# Patient Record
Sex: Female | Born: 1950 | Hispanic: No | Marital: Married | Smoking: Never smoker
Health system: Southern US, Community
[De-identification: ages and names within clinical notes are randomized; demographics above are authoritative.]

## PROBLEM LIST (undated history)

## (undated) DIAGNOSIS — F329 Major depressive disorder, single episode, unspecified: Secondary | ICD-10-CM

## (undated) DIAGNOSIS — I10 Essential (primary) hypertension: Secondary | ICD-10-CM

## (undated) DIAGNOSIS — I700 Atherosclerosis of aorta: Secondary | ICD-10-CM

## (undated) DIAGNOSIS — K769 Liver disease, unspecified: Secondary | ICD-10-CM

## (undated) DIAGNOSIS — K760 Fatty (change of) liver, not elsewhere classified: Secondary | ICD-10-CM

## (undated) DIAGNOSIS — R945 Abnormal results of liver function studies: Secondary | ICD-10-CM

## (undated) DIAGNOSIS — F419 Anxiety disorder, unspecified: Secondary | ICD-10-CM

## (undated) DIAGNOSIS — F32A Depression, unspecified: Secondary | ICD-10-CM

## (undated) DIAGNOSIS — K8020 Calculus of gallbladder without cholecystitis without obstruction: Secondary | ICD-10-CM

## (undated) DIAGNOSIS — M8580 Other specified disorders of bone density and structure, unspecified site: Secondary | ICD-10-CM

## (undated) DIAGNOSIS — R7989 Other specified abnormal findings of blood chemistry: Secondary | ICD-10-CM

## (undated) DIAGNOSIS — K219 Gastro-esophageal reflux disease without esophagitis: Secondary | ICD-10-CM

## (undated) DIAGNOSIS — K449 Diaphragmatic hernia without obstruction or gangrene: Secondary | ICD-10-CM

## (undated) DIAGNOSIS — E559 Vitamin D deficiency, unspecified: Secondary | ICD-10-CM

## (undated) DIAGNOSIS — R7303 Prediabetes: Secondary | ICD-10-CM

## (undated) HISTORY — DX: Vitamin D deficiency, unspecified: E55.9

## (undated) HISTORY — DX: Other specified disorders of bone density and structure, unspecified site: M85.80

## (undated) HISTORY — DX: Anxiety disorder, unspecified: F41.9

## (undated) HISTORY — DX: Essential (primary) hypertension: I10

## (undated) HISTORY — DX: Depression, unspecified: F32.A

## (undated) HISTORY — DX: Prediabetes: R73.03

## (undated) HISTORY — DX: Diaphragmatic hernia without obstruction or gangrene: K44.9

## (undated) HISTORY — DX: Gastro-esophageal reflux disease without esophagitis: K21.9

## (undated) HISTORY — DX: Major depressive disorder, single episode, unspecified: F32.9

## (undated) HISTORY — DX: Calculus of gallbladder without cholecystitis without obstruction: K80.20

## (undated) HISTORY — DX: Other specified abnormal findings of blood chemistry: R79.89

## (undated) HISTORY — DX: Abnormal results of liver function studies: R94.5

## (undated) HISTORY — DX: Liver disease, unspecified: K76.9

## (undated) HISTORY — DX: Atherosclerosis of aorta: I70.0

## (undated) HISTORY — DX: Fatty (change of) liver, not elsewhere classified: K76.0

## (undated) HISTORY — PX: NEPHRECTOMY: SHX65

---

## 1997-05-13 ENCOUNTER — Other Ambulatory Visit: Admission: RE | Admit: 1997-05-13 | Discharge: 1997-05-13 | Payer: Self-pay | Admitting: Obstetrics and Gynecology

## 2000-03-02 ENCOUNTER — Encounter: Payer: Self-pay | Admitting: Obstetrics and Gynecology

## 2000-03-02 ENCOUNTER — Encounter: Admission: RE | Admit: 2000-03-02 | Discharge: 2000-03-02 | Payer: Self-pay | Admitting: Obstetrics and Gynecology

## 2000-07-05 ENCOUNTER — Other Ambulatory Visit: Admission: RE | Admit: 2000-07-05 | Discharge: 2000-07-05 | Payer: Self-pay | Admitting: Obstetrics and Gynecology

## 2001-07-24 ENCOUNTER — Other Ambulatory Visit: Admission: RE | Admit: 2001-07-24 | Discharge: 2001-07-24 | Payer: Self-pay | Admitting: Obstetrics and Gynecology

## 2002-07-24 ENCOUNTER — Encounter: Payer: Self-pay | Admitting: Obstetrics and Gynecology

## 2002-07-24 ENCOUNTER — Encounter: Admission: RE | Admit: 2002-07-24 | Discharge: 2002-07-24 | Payer: Self-pay | Admitting: Obstetrics and Gynecology

## 2002-08-27 ENCOUNTER — Other Ambulatory Visit: Admission: RE | Admit: 2002-08-27 | Discharge: 2002-08-27 | Payer: Self-pay | Admitting: Obstetrics and Gynecology

## 2002-09-06 ENCOUNTER — Encounter: Admission: RE | Admit: 2002-09-06 | Discharge: 2002-09-06 | Payer: Self-pay | Admitting: Obstetrics and Gynecology

## 2002-09-06 ENCOUNTER — Encounter: Payer: Self-pay | Admitting: Obstetrics and Gynecology

## 2003-12-23 ENCOUNTER — Encounter: Admission: RE | Admit: 2003-12-23 | Discharge: 2003-12-23 | Payer: Self-pay | Admitting: Obstetrics and Gynecology

## 2004-12-23 ENCOUNTER — Encounter: Admission: RE | Admit: 2004-12-23 | Discharge: 2004-12-23 | Payer: Self-pay | Admitting: Family Medicine

## 2006-01-09 ENCOUNTER — Inpatient Hospital Stay (HOSPITAL_COMMUNITY): Admission: AD | Admit: 2006-01-09 | Discharge: 2006-01-11 | Payer: Self-pay | Admitting: *Deleted

## 2006-01-11 ENCOUNTER — Ambulatory Visit: Payer: Self-pay | Admitting: Oncology

## 2006-01-19 LAB — COMPREHENSIVE METABOLIC PANEL
AST: 25 U/L (ref 0–37)
Alkaline Phosphatase: 83 U/L (ref 39–117)
BUN: 17 mg/dL (ref 6–23)
CO2: 29 mEq/L (ref 19–32)
Chloride: 101 mEq/L (ref 96–112)
Creatinine, Ser: 0.85 mg/dL (ref 0.40–1.20)
Glucose, Bld: 94 mg/dL (ref 70–99)
Potassium: 3.5 mEq/L (ref 3.5–5.3)

## 2006-01-19 LAB — CBC WITH DIFFERENTIAL/PLATELET
Basophils Absolute: 0 10*3/uL (ref 0.0–0.1)
Eosinophils Absolute: 0.3 10*3/uL (ref 0.0–0.5)
HCT: 42.1 % (ref 34.8–46.6)
HGB: 14.4 g/dL (ref 11.6–15.9)
LYMPH%: 29.2 % (ref 14.0–48.0)
MCV: 90.9 fL (ref 81.0–101.0)
MONO#: 0.6 10*3/uL (ref 0.1–0.9)
MONO%: 5.8 % (ref 0.0–13.0)
NEUT#: 6.6 10*3/uL — ABNORMAL HIGH (ref 1.5–6.5)
Platelets: 303 10*3/uL (ref 145–400)
RBC: 4.63 10*6/uL (ref 3.70–5.32)
WBC: 10.7 10*3/uL — ABNORMAL HIGH (ref 3.9–10.0)
lymph#: 3.1 10*3/uL (ref 0.9–3.3)

## 2006-01-25 ENCOUNTER — Encounter: Admission: RE | Admit: 2006-01-25 | Discharge: 2006-01-25 | Payer: Self-pay | Admitting: Oncology

## 2006-04-13 ENCOUNTER — Encounter: Admission: RE | Admit: 2006-04-13 | Discharge: 2006-04-13 | Payer: Self-pay | Admitting: Interventional Radiology

## 2007-01-16 ENCOUNTER — Encounter: Admission: RE | Admit: 2007-01-16 | Discharge: 2007-01-16 | Payer: Self-pay | Admitting: Family Medicine

## 2007-04-17 ENCOUNTER — Encounter: Admission: RE | Admit: 2007-04-17 | Discharge: 2007-04-17 | Payer: Self-pay | Admitting: Family Medicine

## 2007-04-25 ENCOUNTER — Encounter: Admission: RE | Admit: 2007-04-25 | Discharge: 2007-04-25 | Payer: Self-pay | Admitting: Interventional Radiology

## 2008-01-28 ENCOUNTER — Encounter: Admission: RE | Admit: 2008-01-28 | Discharge: 2008-01-28 | Payer: Self-pay | Admitting: Family Medicine

## 2008-02-05 ENCOUNTER — Encounter: Admission: RE | Admit: 2008-02-05 | Discharge: 2008-02-05 | Payer: Self-pay | Admitting: Family Medicine

## 2008-03-26 ENCOUNTER — Emergency Department (HOSPITAL_COMMUNITY): Admission: EM | Admit: 2008-03-26 | Discharge: 2008-03-26 | Payer: Self-pay | Admitting: Emergency Medicine

## 2009-02-17 ENCOUNTER — Encounter: Admission: RE | Admit: 2009-02-17 | Discharge: 2009-02-17 | Payer: Self-pay | Admitting: Family Medicine

## 2009-03-02 ENCOUNTER — Encounter: Admission: RE | Admit: 2009-03-02 | Discharge: 2009-03-02 | Payer: Self-pay | Admitting: Family Medicine

## 2010-01-31 ENCOUNTER — Encounter: Payer: Self-pay | Admitting: *Deleted

## 2010-01-31 ENCOUNTER — Encounter (HOSPITAL_COMMUNITY): Payer: Self-pay | Admitting: Oncology

## 2010-03-05 ENCOUNTER — Other Ambulatory Visit: Payer: Self-pay | Admitting: Family Medicine

## 2010-03-05 DIAGNOSIS — Z1231 Encounter for screening mammogram for malignant neoplasm of breast: Secondary | ICD-10-CM

## 2010-03-19 ENCOUNTER — Ambulatory Visit
Admission: RE | Admit: 2010-03-19 | Discharge: 2010-03-19 | Disposition: A | Discharge status: Home / Self Care | Payer: 59 | Source: Ambulatory Visit | Attending: Family Medicine | Admitting: Family Medicine

## 2010-03-19 DIAGNOSIS — Z1231 Encounter for screening mammogram for malignant neoplasm of breast: Secondary | ICD-10-CM

## 2010-05-28 NOTE — H&P (Signed)
NAMETENITA, CUE NO.:  [id number]   MEDICAL RECORD NO.:  [id number]          PATIENT TYPE:  INP   LOCATION:  5738                         FACILITY:  [facility]   PHYSICIAN:  Michaelyn Barter, M.D. DATE OF BIRTH:  26-Jan-1950   DATE OF ADMISSION:  01/09/2006  DATE OF DISCHARGE:  01/11/2006                              HISTORY & PHYSICAL   Audio too short to transcribe (less than 5 seconds)      Michaelyn Barter, M.D.     OR/MEDQ  D:  01/14/2006  T:  01/14/2006  Job:  [id number]

## 2010-05-28 NOTE — H&P (Signed)
NAMESAVONNA, BIRCHMEIER NO.:  [id number]   MEDICAL RECORD NO.:  [id number]          PATIENT TYPE:  INP   LOCATION:  5738                         FACILITY:  [facility]   PHYSICIAN:  Hettie Holstein, D.O.    DATE OF BIRTH:  03-18-1950   DATE OF ADMISSION:  01/09/2006  DATE OF DISCHARGE:                              HISTORY & PHYSICAL   PRIMARY CARE PHYSICIAN:  Tammy R. Collins Scotland, M.D.   UROLOGISTLucrezia Starch. Earlene Plater, M.D.   GASTROENTEROLOGIST:  Griffith Citron, M.D.   HISTORY OF PRESENTING ILLNESS:  Alexandra Davies is a very pleasant, 60-  year-old female typically in excellent health who has a history of  nephrectomy at age 6 felt to be possibly related to kidney stones.  In  any event, she has a previous history of hepatitis.  Very few details  are available on this; however, she has undergone periodic blood work in  the outpatient setting through her primary care physician and was noted  to have elevated ALT.  She underwent abdominal ultrasound that was  abnormal and subsequently underwent MRI of her liver that had also been  concerning for a mass and she was referred for diagnostic evaluation as  an inpatient.  She has remained asymptomatic.  At present we are going  to get another CT scan and consider biopsy based on this study.  I have  discussed these images with Dr. Ruel Favors of Interventional Radiology  and has requested a CT scan.   PAST MEDICAL HISTORY:  Her past medical history is significant for:  1. Hepatitis.  No further details available at this time.  She has      seen Dr. Kinnie Scales in 1996 for this.  No biopsy was performed at that      time.  2. History of hypertension.  3. 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  She      has retained her appendix, uterus, gallbladder.  4. 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  5. She states that she follows routinely with her mammograms and these      have always been normal.  6. She has  not had a screening colonoscopy.   MEDICATIONS:  1. She takes Dyazide daily.  2. Estrace cream.  3. Osteo Bioflex.  4. Zoloft 100 mg daily.  5. Mobic on a p.r.n. basis.  6. Xanax 0.5 mg typically prior to any procedures p.r.n.   ALLERGIES:  SHE HAS NO KNOWN DRUG ALLERGIES.   SOCIAL HISTORY:  She is [place].  She has lived here since 1963.  She works for an [employer], [employer].  She denies tobacco.  She drinks 1  or 2 glasses of wine on a daily basis.   FAMILY HISTORY:  She does not know her mother's history.  Her father  died just a week ago at age 28.  There is no known family illnesses or  cancer that they are aware of, though family history is quite limited.   REVIEW OF SYSTEMS:  Her weight and appetite have been stable.  She has  had some blood in her urine that she is being referred to a urologist  for.  She has seen dr. Earlene Plater for this in the past.  She has not had  swelling of her lower extremities.  No blood in her stools.  She is  amenorrheic.  No nausea, vomiting, diarrhea, chest pain or shortness of  breath.  Her review of systems is unremarkable.   PHYSICAL EXAMINATION:  VITAL SIGNS:  Her vital signs were stable.  Temperature 98.3, heart rate 80, respirations 20, blood pressure 162/83,  O2 saturation 97% on room air.   PLAN:  At this time [name] will be admitted to initiate diagnostic  workup.  She will undergo a CT scan.  If biopsy is intended, this may  occur as an inpatient versus outpatient setting.  We will order initial  studies and provide further direction to conclude either as an inpatient  or outpatient setting.  I believe her primary care physician has already  contacted Dr. Arline Asp.      Hettie Holstein, D.O.  Electronically Signed     ESS/MEDQ  D:  01/09/2006  T:  01/10/2006  Job:  [id number]   cc:   Tammy R. Collins Scotland, M.D.  Samul Dada, M.D.  Ronald L. Earlene Plater, M.D.  Griffith Citron, M.D.

## 2010-05-28 NOTE — Discharge Summary (Signed)
NAMESUMAYA, RIEDESEL NO.:  [id number]   MEDICAL RECORD NO.:  [id number]          PATIENT TYPE:  INP   LOCATION:  5738                         FACILITY:  [facility]   PHYSICIAN:  Michaelyn Barter, M.D. DATE OF BIRTH:  [birth year]   DATE OF ADMISSION:  01/09/2006  DATE OF DISCHARGE:  01/11/2006                               DISCHARGE SUMMARY   FINAL DIAGNOSES:  1. Liver mass.  2. Questionable vertebral mets.   PROCEDURES:  1. CT scan of the chest with contrast completed January 10, 2006.  2. CT scan of the abdomen and pelvis with contrast completed January 10, 2006.   CONSULTATIONS:  Interventional radiology.   HISTORY OF PRESENT ILLNESS:  Mrs. Eifler is a 60 year old female who  indicated that she recently underwent routine blood work at her primary  care physician's office and was noted to have had an elevated ALT.  An  abdominal ultrasound was completed and she subsequently underwent an MRI  of her liver.  It indicated that there was a mass present on her liver.  Therefore she was referred to the hospital for evaluation.   PAST MEDICAL HISTORY:  Please see that dictated by Dr. Theodoro Grist on  January 09, 2006.   HOSPITAL COURSE:  1. Liver mass.  The patient underwent a CT scan of the abdomen and      pelvis on January 10, 2006.  The abdominal CT revealed a 4.2 cm      enhancing mass within the caudate lobe of the liver.  The mass was      noted to be homogeneous and showed enhancement during the arterial      phase.  It was felt that the mass could represent a benign lesion      such as an FNH or adenoma, but hypervascular metastasis could not      be completely excluded.  CT scan of the pelvis revealed no acute      findings in the pelvis.  The  patient underwent a CT scan of the      chest on the same date, which revealed a 6 mm subpleural nodule in      the left lung base, nonspecific.  Followup scans were recommended.      Interventional radiology  was consulted.  Dr. Fredia Sorrow was the      radiologist who saw the patient.  He indicated that he spoke with      Mrs. Fujii and her husband about the possibility of a liver      biopsy.  He stated that the lesion was in a high risk area for      postop biopsy bleeding or bile duct injury.  He indicated that one      option was imaging followup to determine growth; however, the      patient desired to have a biopsy.  He recommended proceeding with a      biopsy as an outpatient.  He indicated that he would set it up and      that he would call  the patient with the date and time.  Mrs. Tison      had a CEA completed during the course of her hospitalization, which      was 2.5, which is normal.  An AFP was also completed and was found      to be 3.1.  A hepatitis C antibody was completed and was found to      be negative.  During the completion of her CMP, her ALT was the      only liver enzyme that was found to be elevated and it was found to      be 52.  The patient never complained of any abdominal pain.  She      indicated the day prior to her discharge that she felt as if she      could be discharged home.  However, there was still the question of      the significance of the mass that needed to be addressed.      Therefore, she stayed in the hospital for further evaluation.  2. Questionable vertebral mass.  Again, CT scan of the patient's chest      was completed during the course of this hospitalization and it did      not mention a vertebral mass.  It did mention the presence of a 6      mm subpleural nodule in the left lung base, which was nonspecific      and the radiologist recommended followup scans.  On the date of      discharge, the patient stated that she felt good.  She had no      complaints.  Her vitals:  Her temperature was 97.5, heart rate 76,      respirations 20, blood pressure 105/80 and O2 saturation 97% on      room air.  Her labs:  Her white blood cell count was  9.5,      hemoglobin 14.5, hematocrit 41.3, platelets 272, PT 14.3, INR 1.1,      PTT 32.  The decision was made to discharge the patient home.  The      patient was told to continue her previously prescribed medications.      She was also told to call Dr. Yehuda Budd within the next 2-4 weeks for      followup evaluation.  In addition, Dr. Arline Asp also indicated that      he would arrange an appointment [facility] for      January 17, 2006 at 3:30 p.m.      Michaelyn Barter, M.D.  Electronically Signed     OR/MEDQ  D:  01/14/2006  T:  01/14/2006  Job:  [id number]   cc:   Tammy R. Collins Scotland, M.D.

## 2010-06-14 ENCOUNTER — Other Ambulatory Visit: Payer: Self-pay | Admitting: Family Medicine

## 2010-06-14 DIAGNOSIS — K769 Liver disease, unspecified: Secondary | ICD-10-CM

## 2010-06-21 ENCOUNTER — Ambulatory Visit
Admission: RE | Admit: 2010-06-21 | Discharge: 2010-06-21 | Disposition: A | Discharge status: Home / Self Care | Payer: 59 | Source: Ambulatory Visit | Attending: Family Medicine | Admitting: Family Medicine

## 2010-06-21 DIAGNOSIS — K769 Liver disease, unspecified: Secondary | ICD-10-CM

## 2010-06-21 MED ORDER — GADOBENATE DIMEGLUMINE 529 MG/ML IV SOLN
15.0000 mL | Freq: Once | INTRAVENOUS | Status: AC | PRN
  Administered 2010-06-21: 15 mL via INTRAVENOUS

## 2011-03-31 ENCOUNTER — Other Ambulatory Visit: Payer: Self-pay | Admitting: Family Medicine

## 2011-03-31 DIAGNOSIS — Z1231 Encounter for screening mammogram for malignant neoplasm of breast: Secondary | ICD-10-CM

## 2011-04-20 ENCOUNTER — Ambulatory Visit
Admission: RE | Admit: 2011-04-20 | Discharge: 2011-04-20 | Disposition: A | Discharge status: Home / Self Care | Payer: 59 | Source: Ambulatory Visit | Attending: Family Medicine | Admitting: Family Medicine

## 2011-04-20 DIAGNOSIS — Z1231 Encounter for screening mammogram for malignant neoplasm of breast: Secondary | ICD-10-CM

## 2012-03-20 ENCOUNTER — Other Ambulatory Visit: Payer: Self-pay

## 2012-04-10 LAB — HM MAMMOGRAPHY

## 2012-04-20 ENCOUNTER — Ambulatory Visit
Admission: RE | Admit: 2012-04-20 | Discharge: 2012-04-20 | Disposition: A | Discharge status: Home / Self Care | Payer: 59 | Source: Ambulatory Visit

## 2012-04-20 DIAGNOSIS — Z1231 Encounter for screening mammogram for malignant neoplasm of breast: Secondary | ICD-10-CM

## 2012-05-18 LAB — HM PAP SMEAR

## 2012-05-22 ENCOUNTER — Other Ambulatory Visit: Payer: Self-pay | Admitting: Family Medicine

## 2012-05-22 DIAGNOSIS — R160 Hepatomegaly, not elsewhere classified: Secondary | ICD-10-CM

## 2012-07-06 ENCOUNTER — Ambulatory Visit
Admission: RE | Admit: 2012-07-06 | Discharge: 2012-07-06 | Disposition: A | Discharge status: Home / Self Care | Payer: 59 | Source: Ambulatory Visit | Attending: Family Medicine | Admitting: Family Medicine

## 2012-07-06 DIAGNOSIS — R160 Hepatomegaly, not elsewhere classified: Secondary | ICD-10-CM

## 2012-07-06 MED ORDER — GADOXETATE DISODIUM 0.25 MMOL/ML IV SOLN
7.0000 mL | Freq: Once | INTRAVENOUS | Status: AC | PRN
  Administered 2012-07-06: 7 mL via INTRAVENOUS

## 2013-04-01 ENCOUNTER — Other Ambulatory Visit: Payer: Self-pay

## 2013-04-01 DIAGNOSIS — Z1231 Encounter for screening mammogram for malignant neoplasm of breast: Secondary | ICD-10-CM

## 2013-05-02 ENCOUNTER — Ambulatory Visit
Admission: RE | Admit: 2013-05-02 | Discharge: 2013-05-02 | Disposition: A | Discharge status: Home / Self Care | Payer: Self-pay | Source: Ambulatory Visit

## 2013-05-02 ENCOUNTER — Encounter (INDEPENDENT_AMBULATORY_CARE_PROVIDER_SITE_OTHER): Payer: Self-pay

## 2013-05-02 DIAGNOSIS — Z1231 Encounter for screening mammogram for malignant neoplasm of breast: Secondary | ICD-10-CM

## 2013-05-29 ENCOUNTER — Other Ambulatory Visit: Payer: Self-pay | Admitting: Nurse Practitioner

## 2013-05-29 DIAGNOSIS — D134 Benign neoplasm of liver: Secondary | ICD-10-CM

## 2013-05-29 DIAGNOSIS — D135 Benign neoplasm of extrahepatic bile ducts: Principal | ICD-10-CM

## 2013-06-13 ENCOUNTER — Inpatient Hospital Stay: Admission: RE | Admit: 2013-06-13 | Payer: 59 | Source: Ambulatory Visit

## 2013-06-25 ENCOUNTER — Ambulatory Visit
Admission: RE | Admit: 2013-06-25 | Discharge: 2013-06-25 | Disposition: A | Discharge status: Home / Self Care | Payer: 59 | Source: Ambulatory Visit | Attending: Nurse Practitioner | Admitting: Nurse Practitioner

## 2013-06-25 DIAGNOSIS — D134 Benign neoplasm of liver: Secondary | ICD-10-CM

## 2013-06-25 DIAGNOSIS — D135 Benign neoplasm of extrahepatic bile ducts: Principal | ICD-10-CM

## 2013-06-25 MED ORDER — GADOBENATE DIMEGLUMINE 529 MG/ML IV SOLN
15.0000 mL | Freq: Once | INTRAVENOUS | Status: AC | PRN
  Administered 2013-06-25: 15 mL via INTRAVENOUS

## 2014-07-01 LAB — HM COLONOSCOPY

## 2014-08-22 ENCOUNTER — Other Ambulatory Visit: Payer: Self-pay

## 2014-08-22 DIAGNOSIS — Z1231 Encounter for screening mammogram for malignant neoplasm of breast: Secondary | ICD-10-CM

## 2014-08-28 ENCOUNTER — Ambulatory Visit
Admission: RE | Admit: 2014-08-28 | Discharge: 2014-08-28 | Disposition: A | Discharge status: Home / Self Care | Payer: Commercial Managed Care - HMO | Source: Ambulatory Visit

## 2014-08-28 DIAGNOSIS — Z1231 Encounter for screening mammogram for malignant neoplasm of breast: Secondary | ICD-10-CM

## 2014-09-03 ENCOUNTER — Telehealth: Payer: Self-pay | Admitting: Family Medicine

## 2014-09-03 NOTE — Telephone Encounter (Signed)
Received records form previous provider. Please note what needs to be abstracted or scanned.

## 2014-09-17 ENCOUNTER — Encounter: Payer: Self-pay | Admitting: Family Medicine

## 2014-09-17 ENCOUNTER — Ambulatory Visit (INDEPENDENT_AMBULATORY_CARE_PROVIDER_SITE_OTHER): Payer: Commercial Managed Care - HMO | Admitting: Family Medicine

## 2014-09-17 VITALS — BP 138/88 | HR 64 | Ht 61.25 in | Wt 169.6 lb

## 2014-09-17 DIAGNOSIS — D134 Benign neoplasm of liver: Secondary | ICD-10-CM | POA: Diagnosis not present

## 2014-09-17 DIAGNOSIS — Z91048 Other nonmedicinal substance allergy status: Secondary | ICD-10-CM

## 2014-09-17 DIAGNOSIS — Z23 Encounter for immunization: Secondary | ICD-10-CM | POA: Diagnosis not present

## 2014-09-17 DIAGNOSIS — K219 Gastro-esophageal reflux disease without esophagitis: Secondary | ICD-10-CM | POA: Diagnosis not present

## 2014-09-17 DIAGNOSIS — F418 Other specified anxiety disorders: Secondary | ICD-10-CM | POA: Diagnosis not present

## 2014-09-17 DIAGNOSIS — F329 Major depressive disorder, single episode, unspecified: Secondary | ICD-10-CM

## 2014-09-17 DIAGNOSIS — Z7689 Persons encountering health services in other specified circumstances: Secondary | ICD-10-CM

## 2014-09-17 DIAGNOSIS — E669 Obesity, unspecified: Secondary | ICD-10-CM

## 2014-09-17 DIAGNOSIS — F32A Depression, unspecified: Secondary | ICD-10-CM | POA: Insufficient documentation

## 2014-09-17 DIAGNOSIS — Z7189 Other specified counseling: Secondary | ICD-10-CM

## 2014-09-17 DIAGNOSIS — Z9109 Other allergy status, other than to drugs and biological substances: Secondary | ICD-10-CM | POA: Insufficient documentation

## 2014-09-17 DIAGNOSIS — I10 Essential (primary) hypertension: Secondary | ICD-10-CM | POA: Diagnosis not present

## 2014-09-17 DIAGNOSIS — N390 Urinary tract infection, site not specified: Secondary | ICD-10-CM | POA: Insufficient documentation

## 2014-09-17 NOTE — Patient Instructions (Addendum)
Check your blood pressures 1 time per week and make sure they're not staying elevated, goal is 130/80. It is recommended that you get a minimum of 150 minutes per week of physical activity. Just taking a walk 10-20 minutes a day at a brisk pace is sufficient for this.   DASH Eating Plan DASH stands for "Dietary Approaches to Stop Hypertension." The DASH eating plan is a healthy eating plan that has been shown to reduce high blood pressure (hypertension). Additional health benefits may include reducing the risk of type 2 diabetes mellitus, heart disease, and stroke. The DASH eating plan may also help with weight loss. WHAT DO I NEED TO KNOW ABOUT THE DASH EATING PLAN? For the DASH eating plan, you will follow these general guidelines:  Choose foods with a percent daily value for sodium of less than 5% (as listed on the food label).  Use salt-free seasonings or herbs instead of table salt or sea salt.  Check with your health care provider or pharmacist before using salt substitutes.  Eat lower-sodium products, often labeled as "lower sodium" or "no salt added."  Eat fresh foods.  Eat more vegetables, fruits, and low-fat dairy products.  Choose whole grains. Look for the word "whole" as the first word in the ingredient list.  Choose fish and skinless chicken or [place] more often than red meat. Limit fish, poultry, and meat to 6 oz (170 g) each day.  Limit sweets, desserts, sugars, and sugary drinks.  Choose heart-healthy fats.  Limit cheese to 1 oz (28 g) per day.  Eat more home-cooked food and less restaurant, buffet, and fast food.  Limit fried foods.  Cook foods using methods other than frying.  Limit canned vegetables. If you do use them, rinse them well to decrease the sodium.  When eating at a restaurant, ask that your food be prepared with less salt, or no salt if possible. WHAT FOODS CAN I EAT? Seek help from a dietitian for individual calorie needs. Grains Whole grain  or whole wheat bread. Brown rice. Whole grain or whole wheat pasta. Quinoa, bulgur, and whole grain cereals. Low-sodium cereals. Corn or whole wheat flour tortillas. Whole grain cornbread. Whole grain crackers. Low-sodium crackers. Vegetables Fresh or frozen vegetables (raw, steamed, roasted, or grilled). Low-sodium or reduced-sodium tomato and vegetable juices. Low-sodium or reduced-sodium tomato sauce and paste. Low-sodium or reduced-sodium canned vegetables.  Fruits All fresh, canned (in natural juice), or frozen fruits. Meat and Other Protein Products Ground beef (85% or leaner), grass-fed beef, or beef trimmed of fat. Skinless chicken or [place]. Ground chicken or [place]. Pork trimmed of fat. All fish and seafood. Eggs. Dried beans, peas, or lentils. Unsalted nuts and seeds. Unsalted canned beans. Dairy Low-fat dairy products, such as skim or 1% milk, 2% or reduced-fat cheeses, low-fat ricotta or cottage cheese, or plain low-fat yogurt. Low-sodium or reduced-sodium cheeses. Fats and Oils Tub margarines without trans fats. Light or reduced-fat mayonnaise and salad dressings (reduced sodium). Avocado. Safflower, olive, or canola oils. Natural peanut or almond butter. Other Unsalted popcorn and pretzels. The items listed above may not be a complete list of recommended foods or beverages. Contact your dietitian for more options. WHAT FOODS ARE NOT RECOMMENDED? Grains White bread. White pasta. White rice. Refined cornbread. Bagels and croissants. Crackers that contain trans fat. Vegetables Creamed or fried vegetables. Vegetables in a cheese sauce. Regular canned vegetables. Regular canned tomato sauce and paste. Regular tomato and vegetable juices. Fruits Dried fruits. Canned fruit in light or  heavy syrup. Fruit juice. Meat and Other Protein Products Fatty cuts of meat. Ribs, chicken wings, bacon, sausage, bologna, salami, chitterlings, fatback, hot dogs, bratwurst, and packaged luncheon meats.  Salted nuts and seeds. Canned beans with salt. Dairy Whole or 2% milk, cream, half-and-half, and cream cheese. Whole-fat or sweetened yogurt. Full-fat cheeses or blue cheese. Nondairy creamers and whipped toppings. Processed cheese, cheese spreads, or cheese curds. Condiments Onion and garlic salt, seasoned salt, table salt, and sea salt. Canned and packaged gravies. Worcestershire sauce. Tartar sauce. Barbecue sauce. Teriyaki sauce. Soy sauce, including reduced sodium. Steak sauce. Fish sauce. Oyster sauce. Cocktail sauce. Horseradish. Ketchup and mustard. Meat flavorings and tenderizers. Bouillon cubes. Hot sauce. Tabasco sauce. Marinades. Taco seasonings. Relishes. Fats and Oils Butter, stick margarine, lard, shortening, ghee, and bacon fat. Coconut, palm kernel, or palm oils. Regular salad dressings. Other Pickles and olives. Salted popcorn and pretzels. The items listed above may not be a complete list of foods and beverages to avoid. Contact your dietitian for more information. WHERE CAN I FIND MORE INFORMATION? National Heart, Lung, and Blood Institute: travelstabloid.com Document Released: 12/16/2010 Document Revised: 05/13/2013 Document Reviewed: 10/31/2012 [facility] Patient Information 2015 [place], [place]. This information is not intended to replace advice given to you by your health care provider. Make sure you discuss any questions you have with your health care provider.    Gastroesophageal Reflux Disease, Adult Gastroesophageal reflux disease (GERD) happens when acid from your stomach flows up into the esophagus. When acid comes in contact with the esophagus, the acid causes soreness (inflammation) in the esophagus. Over time, GERD may create small holes (ulcers) in the lining of the esophagus. CAUSES   Increased body weight. This puts pressure on the stomach, making acid rise from the stomach into the esophagus.  Smoking. This increases acid  production in the stomach.  Drinking alcohol. This causes decreased pressure in the lower esophageal sphincter (valve or ring of muscle between the esophagus and stomach), allowing acid from the stomach into the esophagus.  Late evening meals and a full stomach. This increases pressure and acid production in the stomach.  A malformed lower esophageal sphincter. Sometimes, no cause is found. SYMPTOMS   Burning pain in the lower part of the mid-chest behind the breastbone and in the mid-stomach area. This may occur twice a week or more often.  Trouble swallowing.  Sore throat.  Dry cough.  Asthma-like symptoms including chest tightness, shortness of breath, or wheezing. DIAGNOSIS  Your caregiver may be able to diagnose GERD based on your symptoms. In some cases, X-rays and other tests may be done to check for complications or to check the condition of your stomach and esophagus. TREATMENT  Your caregiver may recommend over-the-counter or prescription medicines to help decrease acid production. Ask your caregiver before starting or adding any new medicines.  HOME CARE INSTRUCTIONS   Change the factors that you can control. Ask your caregiver for guidance concerning weight loss, quitting smoking, and alcohol consumption.  Avoid foods and drinks that make your symptoms worse, such as:  Caffeine or alcoholic drinks.  Chocolate.  Peppermint or mint flavorings.  Garlic and onions.  Spicy foods.  Citrus fruits, such as oranges, lemons, or limes.  Tomato-based foods such as sauce, chili, salsa, and pizza.  Fried and fatty foods.  Avoid lying down for the 3 hours prior to your bedtime or prior to taking a nap.  Eat small, frequent meals instead of large meals.  Wear loose-fitting clothing. Do not wear  anything tight around your waist that causes pressure on your stomach.  Raise the head of your bed 6 to 8 inches with wood blocks to help you sleep. Extra pillows will not  help.  Only take over-the-counter or prescription medicines for pain, discomfort, or fever as directed by your caregiver.  Do not take aspirin, ibuprofen, or other nonsteroidal anti-inflammatory drugs (NSAIDs). SEEK IMMEDIATE MEDICAL CARE IF:   You have pain in your arms, neck, jaw, teeth, or back.  Your pain increases or changes in intensity or duration.  You develop nausea, vomiting, or sweating (diaphoresis).  You develop shortness of breath, or you faint.  Your vomit is green, yellow, black, or looks like coffee grounds or blood.  Your stool is red, bloody, or black. These symptoms could be signs of other problems, such as heart disease, gastric bleeding, or esophageal bleeding. MAKE SURE YOU:   Understand these instructions.  Will watch your condition.  Will get help right away if you are not doing well or get worse. Document Released: 10/06/2004 Document Revised: 03/21/2011 Document Reviewed: 07/16/2010 [facility] Patient Information 2015 [place], [place]. This information is not intended to replace advice given to you by your health care provider. Make sure you discuss any questions you have with your health care provider.   Food Choices for Gastroesophageal Reflux Disease When you have gastroesophageal reflux disease (GERD), the foods you eat and your eating habits are very important. Choosing the right foods can help ease the discomfort of GERD. WHAT GENERAL GUIDELINES DO I NEED TO FOLLOW?  Choose fruits, vegetables, whole grains, low-fat dairy products, and low-fat meat, fish, and poultry.  Limit fats such as oils, salad dressings, butter, nuts, and avocado.  Keep a food diary to identify foods that cause symptoms.  Avoid foods that cause reflux. These may be different for different people.  Eat frequent small meals instead of three large meals each day.  Eat your meals slowly, in a relaxed setting.  Limit fried foods.  Cook foods using methods other than  frying.  Avoid drinking alcohol.  Avoid drinking large amounts of liquids with your meals.  Avoid bending over or lying down until 2-3 hours after eating. WHAT FOODS ARE NOT RECOMMENDED? The following are some foods and drinks that may worsen your symptoms: Vegetables Tomatoes. Tomato juice. Tomato and spaghetti sauce. Chili peppers. Onion and garlic. Horseradish. Fruits Oranges, grapefruit, and lemon (fruit and juice). Meats High-fat meats, fish, and poultry. This includes hot dogs, ribs, ham, sausage, salami, and bacon. Dairy Whole milk and chocolate milk. Sour cream. Cream. Butter. Ice cream. Cream cheese.  Beverages Coffee and tea, with or without caffeine. Carbonated beverages or energy drinks. Condiments Hot sauce. Barbecue sauce.  Sweets/Desserts Chocolate and cocoa. Donuts. Peppermint and spearmint. Fats and Oils High-fat foods, including [place] fries and potato chips. Other Vinegar. Strong spices, such as black pepper, white pepper, red pepper, cayenne, curry powder, cloves, ginger, and chili powder. The items listed above may not be a complete list of foods and beverages to avoid. Contact your dietitian for more information. Document Released: 12/27/2004 Document Revised: 01/01/2013 Document Reviewed: 10/31/2012 [facility] Patient Information 2015 [place], [place]. This information is not intended to replace advice given to you by your health care provider. Make sure you discuss any questions you have with your health care provider.

## 2014-09-17 NOTE — Progress Notes (Signed)
Subjective:    Patient ID: Alexandra Davies, female    DOB: March 24, 1950, 64 y.o.   MRN: [id number]  HPI She is new to the practice and here to establish primary care. She has seen Dr. Charleston Poot in the past. She has no complaints today.  Reports having a number of health problems that she is managing very well and that she feels healthy.  Discussed that she had hepatitis at age 41 and has a benign liver mass that is being followed. Did not have a biopsy and states this was due to the fact that it's pressing on her vena cava. She did see oncology and a liver specialist for this and her primary care has been watching it she states she gets a scan every other year now. Reports history of depression and states this started when she was going through menopause. She states she continues to take 50 mg of sertraline daily and feels great. She used to take Xanax when she traveled but does not travel as much anymore. States she developed allergies fairly recently and saw an allergist who told her she was allergic to dust mite and mold. Takes a nasal spray.   She has not been watching her diet or exercising like in previous years, is interested in doing this again.   She is married and happy at home, reports healthy sex life without problems. No children. Born in [place] and moved her as child. Sister also lives nearby. Has a cat.  She is retired from working in Insurance underwriter. She has a very optimistic outlook on life. Sleeps well 8 hours per night  Reviewed allergies, medications, past medical history, social and family history. Reviewed her chart from her previous provider. Also reviewed health maintenance.  Colonoscopy - UTD, every 5 years, Mammogram- UTD. Pap smear needs next year per patient.  Had an EGD for reflux and was told she had hiatal hernia.   No immunization records are on file. She would like a flu shot today. She is not sure if she ever had chickenpox and would like to find this out.    Review of Systems Review of Systems Constitutional: -fever, -chills, -sweats, -unexpected weight change,-fatigue Cardiology:  -chest pain, -palpitations, -edema Respiratory: -cough, -shortness of breath, -wheezing Gastroenterology: -abdominal pain, -nausea, -vomiting, -diarrhea, -constipation  Musculoskeletal: + occasional knee pain,- other arthralgias, -myalgias, -joint swelling, -back pain Urology: -dysuria, -difficulty urinating, -hematuria, -urinary frequency, -urgency Neurology: -headache, -weakness, -tingling, -numbness       Objective:   Physical Exam  Constitutional: She is oriented to person, place, and time. She appears well-developed and well-nourished. No distress.  Pulmonary/Chest: Effort normal. No respiratory distress.  Neurological: She is alert and oriented to person, place, and time.  Skin: Skin is warm and dry. No rash noted.  Psychiatric: She has a normal mood and affect. Her behavior is normal. Judgment and thought content normal.          Assessment & Plan:  Encounter to establish care  Essential hypertension  Gastroesophageal reflux disease, esophagitis presence not specified  Depression  Situational anxiety  Obesity (BMI 30-39.9)  Liver tumor (benign)  Environmental allergies  Need for prophylactic vaccination and inoculation against influenza - Plan: Flu Vaccine QUAD 36+ mos PF IM (Fluarix & Fluzone Quad PF)  Spent a more than 45 minutes with patient and greater than 50% of visit counseling and discussing past medical history.  Discussed that we will keep her on sertraline 50 mg since this seems to  be working for her depression. She denies having a need to take anti-anxiety medications since she is no longer traveling as often, her husband used to work as a Insurance underwriter and she traveled a great deal then. Also discussed in depth diet and exercise. Recommended that she get a minimum of 150 minutes of physical activity per week. Provided her with the  DASH eating plan for HTN and she will continue taking medication. Also advised her to start checking her blood pressure once every week or so and just make sure they're staying at goal. She will let me know if her blood pressure is consistently elevated.  Her environmental allergies are under control with nasal spray the allergist prescribed and she will stay on this. She will also try to avoid triggers of mold and dust mites. Discussed in depth GERD, triggers and nonpharmacological therapies and she will continue taking reflux medication.   Discussed that her previous primary care provider has been keeping an eye on liver mass and that we can continue to do this. She is due to have a repeat CT or MRI next year. We will get oncology (Dr. Theodis Aguas?) or liver specialist involved if needed in future.   Flu shot given today. She will try to find out if she has had screening for Hep C and what immunizations she has had.  She is not sure if she has ever had chicken pox and would like to know. Discussed the possibility of drawing a titer.

## 2014-11-19 ENCOUNTER — Other Ambulatory Visit: Payer: Self-pay | Admitting: Family Medicine

## 2014-11-21 ENCOUNTER — Encounter: Payer: Self-pay | Admitting: Family Medicine

## 2014-11-21 ENCOUNTER — Ambulatory Visit (INDEPENDENT_AMBULATORY_CARE_PROVIDER_SITE_OTHER): Payer: Commercial Managed Care - HMO | Admitting: Family Medicine

## 2014-11-21 VITALS — BP 120/84 | HR 85 | Temp 98.4°F | Ht 61.0 in | Wt 167.0 lb

## 2014-11-21 DIAGNOSIS — J069 Acute upper respiratory infection, unspecified: Secondary | ICD-10-CM

## 2014-11-21 MED ORDER — AMOXICILLIN 875 MG PO TABS: 875.0000 mg | ORAL_TABLET | Freq: Two times a day (BID) | ORAL | Status: DC

## 2014-11-21 NOTE — Progress Notes (Signed)
   Subjective:    Patient ID: Alexandra Davies, female    DOB: 1950/09/09, 64 y.o.   MRN: [id number]  HPI  she complains of a six-day history of stroke with rhinorrhea, sneezing, cough, earache and headache. The symptoms have continued. No fever, chills. She also complains of some itchy watery eyes.   Review of Systems     Objective:   Physical Exam Alert and in no distress. Tympanic membranes and canals are normal. Pharyngeal area is normal. Neck is supple without adenopathy or thyromegaly. Cardiac exam shows a regular sinus rhythm without murmurs or gallops. Lungs are clear to auscultation.  nasal mucosa is normal with no tenderness over sinuses       Assessment & Plan:  Acute URI - Plan: amoxicillin (AMOXIL) 875 MG tablet  recommend she treat this symptomatically initially but did call in the Amoxil. If she gets worse, she will start the antibiotic.

## 2014-11-21 NOTE — Patient Instructions (Addendum)
Don't fill the antibiotic quite yet and give it a couple of days Can take 4 ibuprofen 3 times per day for fever and headache

## 2014-11-25 ENCOUNTER — Encounter: Payer: Self-pay | Admitting: Family Medicine

## 2015-02-03 ENCOUNTER — Encounter: Payer: Self-pay | Admitting: Internal Medicine

## 2015-02-11 ENCOUNTER — Telehealth: Payer: Self-pay | Admitting: Family Medicine

## 2015-02-11 MED ORDER — TRIAMTERENE-HCTZ 37.5-25 MG PO TABS: 1.0000 | ORAL_TABLET | Freq: Every day | ORAL | Status: DC

## 2015-02-11 NOTE — Telephone Encounter (Signed)
Rcvd refill request for Triamterene 37.5-25 mg #30

## 2015-02-11 NOTE — Telephone Encounter (Signed)
Ok to refill 

## 2015-02-11 NOTE — Telephone Encounter (Signed)
done

## 2015-02-24 ENCOUNTER — Encounter: Payer: Self-pay | Admitting: Family Medicine

## 2015-05-12 ENCOUNTER — Other Ambulatory Visit: Payer: Self-pay | Admitting: Family Medicine

## 2015-05-14 ENCOUNTER — Other Ambulatory Visit: Payer: Self-pay | Admitting: Family Medicine

## 2015-06-05 ENCOUNTER — Ambulatory Visit (INDEPENDENT_AMBULATORY_CARE_PROVIDER_SITE_OTHER): Payer: Commercial Managed Care - HMO | Admitting: Medical

## 2015-06-05 ENCOUNTER — Encounter: Payer: Self-pay | Admitting: Medical

## 2015-06-05 VITALS — BP 120/80 | HR 75 | Temp 98.7°F | Wt 169.0 lb

## 2015-06-05 DIAGNOSIS — B999 Unspecified infectious disease: Secondary | ICD-10-CM

## 2015-06-05 DIAGNOSIS — H019 Unspecified inflammation of eyelid: Secondary | ICD-10-CM

## 2015-06-05 MED ORDER — MUPIROCIN 2 % EX OINT: TOPICAL_OINTMENT | CUTANEOUS | Status: DC

## 2015-06-05 NOTE — Progress Notes (Signed)
Subjective: Chief Complaint  Patient presents with  . swollen eye lid    flakey, itchy, and red. started a week ago.    Here for eye c/o.    Has left upper eyelid swollen x 2 days, but has been flaky and itchy for the past week.   No prior similar.  No sick contacts with eye infection.  Denies specific injury or trauma.   Vision is fine.   No watery eye, no pus, no heat, no drainage.   No fever.  Generally wears makeup.   She notes just seeing dermatology 3 weeks ago and no issue at that time.  She does go the beach often, has convertible, but wears sunscreen on face.  No other aggravating or relieving factors. No other complaint.   Objective: BP 120/80 mmHg  Pulse 75  Temp(Src) 98.7 F (37.1 C) (Tympanic)  Wt 169 lb (76.658 kg)  Gen: wd, wn, nad Left upper medial eye lid with raised 60mm roundish lesion with linear erythema and furrow suggestive of abrasion, some surrounding flaky skin PERRLA, EOMi, otherwise no swelling erythema, crusting, drainage or other findings   Assessment: Encounter Diagnosis  Name Primary?  . Eyelid dermatitis, infectious Yes    Plan: discussed finding.  Begin mupirocin ointment, warm compresses, soap and water cleaning.  If not resolved within 10-14 days, then go see dermatology.

## 2015-09-12 ENCOUNTER — Other Ambulatory Visit: Payer: Self-pay | Admitting: Family Medicine

## 2015-10-08 ENCOUNTER — Other Ambulatory Visit: Payer: Self-pay | Admitting: Family Medicine

## 2015-10-13 ENCOUNTER — Other Ambulatory Visit: Payer: Self-pay | Admitting: Family Medicine

## 2015-10-13 DIAGNOSIS — Z1231 Encounter for screening mammogram for malignant neoplasm of breast: Secondary | ICD-10-CM

## 2015-10-19 ENCOUNTER — Encounter: Payer: Self-pay | Admitting: Family Medicine

## 2015-10-19 ENCOUNTER — Ambulatory Visit (INDEPENDENT_AMBULATORY_CARE_PROVIDER_SITE_OTHER): Payer: Commercial Managed Care - HMO | Admitting: Family Medicine

## 2015-10-19 ENCOUNTER — Other Ambulatory Visit (HOSPITAL_COMMUNITY)
Admission: RE | Admit: 2015-10-19 | Discharge: 2015-10-19 | Disposition: A | Discharge status: Home / Self Care | Payer: Commercial Managed Care - HMO | Source: Ambulatory Visit | Attending: Family Medicine | Admitting: Family Medicine

## 2015-10-19 VITALS — BP 120/80 | HR 77 | Ht 61.25 in | Wt 169.2 lb

## 2015-10-19 DIAGNOSIS — E559 Vitamin D deficiency, unspecified: Secondary | ICD-10-CM

## 2015-10-19 DIAGNOSIS — Z01419 Encounter for gynecological examination (general) (routine) without abnormal findings: Secondary | ICD-10-CM | POA: Diagnosis present

## 2015-10-19 DIAGNOSIS — E782 Mixed hyperlipidemia: Secondary | ICD-10-CM

## 2015-10-19 DIAGNOSIS — Z124 Encounter for screening for malignant neoplasm of cervix: Secondary | ICD-10-CM | POA: Diagnosis not present

## 2015-10-19 DIAGNOSIS — Z0000 Encounter for general adult medical examination without abnormal findings: Secondary | ICD-10-CM

## 2015-10-19 DIAGNOSIS — Z23 Encounter for immunization: Secondary | ICD-10-CM | POA: Diagnosis not present

## 2015-10-19 DIAGNOSIS — R7309 Other abnormal glucose: Secondary | ICD-10-CM | POA: Diagnosis not present

## 2015-10-19 DIAGNOSIS — I10 Essential (primary) hypertension: Secondary | ICD-10-CM

## 2015-10-19 DIAGNOSIS — M8580 Other specified disorders of bone density and structure, unspecified site: Secondary | ICD-10-CM

## 2015-10-19 DIAGNOSIS — Z1151 Encounter for screening for human papillomavirus (HPV): Secondary | ICD-10-CM | POA: Insufficient documentation

## 2015-10-19 DIAGNOSIS — K219 Gastro-esophageal reflux disease without esophagitis: Secondary | ICD-10-CM | POA: Diagnosis not present

## 2015-10-19 DIAGNOSIS — K769 Liver disease, unspecified: Secondary | ICD-10-CM

## 2015-10-19 LAB — CBC WITH DIFFERENTIAL/PLATELET
Basophils Absolute: 0 cells/uL (ref 0–200)
Basophils Relative: 0 %
Eosinophils Absolute: 170 cells/uL (ref 15–500)
Eosinophils Relative: 2 %
HEMATOCRIT: 46.6 % — AB (ref 35.0–45.0)
Hemoglobin: 15.5 g/dL (ref 11.7–15.5)
LYMPHS PCT: 35 %
Lymphs Abs: 2975 cells/uL (ref 850–3900)
MCH: 30.8 pg (ref 27.0–33.0)
MCHC: 33.3 g/dL (ref 32.0–36.0)
MCV: 92.5 fL (ref 80.0–100.0)
MONO ABS: 510 {cells}/uL (ref 200–950)
MPV: 10.1 fL (ref 7.5–12.5)
Monocytes Relative: 6 %
NEUTROS PCT: 57 %
Neutro Abs: 4845 cells/uL (ref 1500–7800)
Platelets: 243 10*3/uL (ref 140–400)
RBC: 5.04 MIL/uL (ref 3.80–5.10)
RDW: 12.5 % (ref 11.0–15.0)
WBC: 8.5 10*3/uL (ref 4.0–10.5)

## 2015-10-19 LAB — TSH: TSH: 1.06 mIU/L

## 2015-10-19 LAB — COMPLETE METABOLIC PANEL WITH GFR
ALK PHOS: 71 U/L (ref 33–130)
ALT: 61 U/L — ABNORMAL HIGH (ref 6–29)
AST: 45 U/L — AB (ref 10–35)
Albumin: 4.2 g/dL (ref 3.6–5.1)
BILIRUBIN TOTAL: 0.4 mg/dL (ref 0.2–1.2)
BUN: 12 mg/dL (ref 7–25)
CO2: 29 mmol/L (ref 20–31)
CREATININE: 0.74 mg/dL (ref 0.50–0.99)
Calcium: 9.3 mg/dL (ref 8.6–10.4)
Chloride: 99 mmol/L (ref 98–110)
GFR, EST NON AFRICAN AMERICAN: 86 mL/min (ref >=60)
GLUCOSE: 104 mg/dL — AB (ref 65–99)
Potassium: 4.1 mmol/L (ref 3.5–5.3)
Sodium: 140 mmol/L (ref 135–146)
Total Protein: 7.2 g/dL (ref 6.1–8.1)

## 2015-10-19 LAB — LIPID PANEL
CHOL/HDL RATIO: 4.3 ratio (ref <=5.0)
Cholesterol: 226 mg/dL — ABNORMAL HIGH (ref 125–200)
HDL: 53 mg/dL (ref >=46)
LDL CALC: 141 mg/dL — AB (ref <130)
Triglycerides: 161 mg/dL — ABNORMAL HIGH (ref <150)
VLDL: 32 mg/dL — AB (ref <30)

## 2015-10-19 LAB — POCT URINALYSIS DIPSTICK
BILIRUBIN UA: NEGATIVE
Ketones, UA: NEGATIVE
Leukocytes, UA: NEGATIVE
NITRITE UA: NEGATIVE
PH UA: 7
Protein, UA: NEGATIVE
RBC UA: NEGATIVE
SPEC GRAV UA: 1.01
Urobilinogen, UA: NEGATIVE

## 2015-10-19 NOTE — Progress Notes (Signed)
Subjective:    Patient ID: Alexandra Davies, female    DOB: November 25, 1950, 65 y.o.   MRN: [id number]  HPI Chief Complaint  Patient presents with  . cpe    fasting cpe. will give flu shot today   She is here for a complete physical exam.  Last CPE: 06/2014 She has seen Dr. Charleston Poot in the past.  Reports having a number of health problems that she is managing very well and that she feels healthy.  Discussed that she had hepatitis at age 35 and has a benign liver mass that needs to be followed. Did not have a biopsy and states this was due to the fact that it's pressing on her vena cava. She did see oncology and a liver specialist for this and her primary care has been watching it. states she gets a scan every other year now and has not had one since 2015.  Reports history of depression and states this started when she was going through menopause.  States she developed allergies fairly recently and saw an allergist who told her she was allergic to dust mite and mold. Uses a nasal spray and recently added an oral antihistamine.   Is taking women's multivitamin.  History of osteopenia and taking calcium and vitamin D.  Had measles as a child.   Stopped taking Xanax and Zoloft last November and states she is doing fine.   Other providers: Dr. Paulita Fujita [facility], allergist, dermatologist.   Diet: intermittently healthy. She has not been watching her diet or exercising like in previous years, is interested in doing this again.  Excerise: walking occasionally.   Immunizations: Tdap in past 5 years per patient.  Shingles- never.  Flu shot today.   Health maintenance:  Mammogram: has one scheduled for tomorrow.  Colonoscopy: January 2017 and EGD for reflux and was told she had hiatal hernia. Last Gynecological Exam: 3 years. Needs a pap smear.  Last Menstrual cycle: age 79. Took OCPs from age [age] DEXA: several years and needs this.   Last Dental Exam: twice annually.  Last Eye Exam: up to  date.   She is married and happy at home. No children. Born in [place] and moved her as child. Sister also lives nearby. Has a cat.  She is retired from working in Insurance underwriter. She has a very optimistic outlook on life. Sleeps well 8 hours per night  Wears seatbelt always, uses sunscreen, smoke detectors in home and functioning, has a gun and permit for this, does not text while driving and feels safe in home environment.   Reviewed allergies, medications, past medical, surgical, family, and social history.   Review of Systems Review of Systems Constitutional: -fever, -chills, -sweats, -unexpected weight change,-fatigue ENT: -runny nose, -ear pain, -sore throat Cardiology:  -chest pain, -palpitations, -edema Respiratory: -cough, -shortness of breath, -wheezing Gastroenterology: -abdominal pain, -nausea, -vomiting, -diarrhea, -constipation  Hematology: -bleeding or bruising problems Musculoskeletal: -arthralgias, -myalgias, -joint swelling, -back pain Ophthalmology: -vision changes Urology: -dysuria, -difficulty urinating, -hematuria, -urinary frequency, -urgency Neurology: -headache, -weakness, -tingling, -numbness       Objective:   Physical Exam  BP 120/80   Pulse 77   Ht 5' 1.25" (1.556 m)   Wt 169 lb 3.2 oz (76.7 kg)   BMI 31.71 kg/m   General Appearance:    Alert, cooperative, no distress, appears stated age  Head:    Normocephalic, without obvious abnormality, atraumatic  Eyes:    PERRL, conjunctiva/corneas clear, EOM's intact, fundi  benign  Ears:    Normal TM's and external ear canals  Nose:   Nares normal, mucosa normal, no drainage or sinus   tenderness  Throat:   Lips, mucosa, and tongue normal; teeth and gums normal  Neck:   Supple, no lymphadenopathy;  thyroid:  no   enlargement/tenderness/nodules; no carotid   bruit or JVD  Back:    Spine nontender, no curvature, ROM normal, no CVA     tenderness  Lungs:     Clear to auscultation bilaterally without wheezes,  rales or     ronchi; respirations unlabored  Chest Wall:    No tenderness or deformity   Heart:    Regular rate and rhythm, S1 and S2 normal, no murmur, rub   or gallop  Breast Exam:    Not done. Mammogram tomorrow.  No axillary lymphadenopathy  Abdomen:     Soft, non-tender, nondistended, normoactive bowel sounds,    no masses, no hepatosplenomegaly  Genitalia:    Normal external genitalia without lesions.  BUS and vagina normal; cervix without lesions, or cervical motion tenderness. No abnormal vaginal discharge.  Uterus and adnexa not enlarged, nontender, no masses.  Pap performed. Chaperone present.   Rectal:   declined. Up to date on colonoscopy.   Extremities:   No clubbing, cyanosis or edema  Pulses:   2+ and symmetric all extremities  Skin:   Skin color, texture, turgor normal, no rashes or lesions  Lymph nodes:   Cervical, supraclavicular, and axillary nodes normal  Neurologic:   CNII-XII intact, normal strength, sensation and gait; reflexes 2+ and symmetric throughout          Psych:   Normal mood, affect, hygiene and grooming.     Urinalysis dipstick: negative.      Assessment & Plan:  Routine general medical examination at a health care facility - Plan: CBC with Differential/Platelet, COMPLETE METABOLIC PANEL WITH GFR, POCT urinalysis dipstick, TSH, Lipid panel  Essential hypertension  Gastroesophageal reflux disease, esophagitis presence not specified  Osteopenia determined by x-ray - Plan: VITAMIN D 25 Hydroxy (Vit-D Deficiency, Fractures), DG Bone Density  Screening for cervical cancer - Plan: Cytology - PAP  Vitamin D deficiency - Plan: VITAMIN D 25 Hydroxy (Vit-D Deficiency, Fractures)  Need for prophylactic vaccination and inoculation against influenza - Plan: Flu Vaccine QUAD 36+ mos IM  Elevated hemoglobin A1c - Plan: Hemoglobin A1c  Mixed hyperlipidemia - Plan: Lipid panel  Liver lesion - Plan: MR Abdomen W Wo Contrast  Discussed that she appears to be  healthy and doing well physically and emotionally.  HTN- managed on current medication. Continue on this.  GERD- she will try to take some days off from omeprazole. Discussed possible side effects such as absorption issues. Discussed GERD management.  Bone density ordered to follow osteopenia. Continue on vitamin D and multivitamins. Discussed weight bearing exercises.  Flu shot given.  History of elevated hemoglobin A1C at 6.0%. She states she is not aware of this. Plan to check this today.  History of hyperlipidemia and took medication at one point for a short time. She then controlled this with diet and exercise.  MR abdomen ordered to follow liver lesion as recommended.  Follow up pending labs.

## 2015-10-19 NOTE — Patient Instructions (Addendum)
Call and check with your insurance about the shingles vaccine (ZOSTAVAX).  I recommend that you get a bone density exam and this order is in the computer.  You received your flu shot today.  Try skipping doses of acid reducers for reflux as tolerated.   Preventive Care for Adults - Female      MAINTAIN REGULAR HEALTH EXAMS:  A routine yearly physical is a good way to check in with your primary care provider about your health and preventive screening. It is also an opportunity to share updates about your health and any concerns you have, and receive a thorough all-over exam.   Most health insurance companies pay for at least some preventative services.  Check with your health plan for specific coverages.  WHAT PREVENTATIVE SERVICES DO WOMEN NEED?  Adult women should have their weight and blood pressure checked regularly.   Women age 65 and older should have their cholesterol levels checked regularly.  Women should be screened for cervical cancer with a Pap smear and pelvic exam beginning at either age 71, or 3 years after they become sexually activity.    Breast cancer screening generally begins at age 5 with a mammogram and breast exam by your primary care provider.    Beginning at age 53 and continuing to age 81, women should be screened for colorectal cancer.  Certain people may need continued testing until age 5.  Updating vaccinations is part of preventative care.  Vaccinations help protect against diseases such as the flu.  Osteoporosis is a disease in which the bones lose minerals and strength as we age. Women ages 57 and over should discuss this with their caregivers, as should women after menopause who have other risk factors.  Lab tests are generally done as part of preventative care to screen for anemia and blood disorders, to screen for problems with the kidneys and liver, to screen for bladder problems, to check blood sugar, and to check your cholesterol  level.  Preventative services generally include counseling about diet, exercise, avoiding tobacco, drugs, excessive alcohol consumption, and sexually transmitted infections.    GENERAL RECOMMENDATIONS FOR GOOD HEALTH:  Healthy diet:  Eat a variety of foods, including fruit, vegetables, animal or vegetable protein, such as meat, fish, chicken, and eggs, or beans, lentils, tofu, and grains, such as rice.  Drink plenty of water daily.  Decrease saturated fat in the diet, avoid lots of red meat, processed foods, sweets, fast foods, and fried foods.  Exercise:  Aerobic exercise helps maintain good heart health. At least 30-40 minutes of moderate-intensity exercise is recommended. For example, a brisk walk that increases your heart rate and breathing. This should be done on most days of the week.   Find a type of exercise or a variety of exercises that you enjoy so that it becomes a part of your daily life.  Examples are running, walking, swimming, water aerobics, and biking.  For motivation and support, explore group exercise such as aerobic class, spin class, Zumba, Yoga,or  martial arts, etc.    Set exercise goals for yourself, such as a certain weight goal, walk or run in a race such as a 5k walk/run.  Speak to your primary care provider about exercise goals.  Disease prevention:  If you smoke or chew tobacco, find out from your caregiver how to quit. It can literally save your life, no matter how long you have been a tobacco user. If you do not use tobacco, never begin.  Maintain a healthy diet and normal weight. Increased weight leads to problems with blood pressure and diabetes.   The Body Mass Index or BMI is a way of measuring how much of your body is fat. Having a BMI above 27 increases the risk of heart disease, diabetes, hypertension, stroke and other problems related to obesity. Your caregiver can help determine your BMI and based on it develop an exercise and dietary program to  help you achieve or maintain this important measurement at a healthful level.  High blood pressure causes heart and blood vessel problems.  Persistent high blood pressure should be treated with medicine if weight loss and exercise do not work.   Fat and cholesterol leaves deposits in your arteries that can block them. This causes heart disease and vessel disease elsewhere in your body.  If your cholesterol is found to be high, or if you have heart disease or certain other medical conditions, then you may need to have your cholesterol monitored frequently and be treated with medication.   Ask if you should have a cardiac stress test if your history suggests this. A stress test is a test done on a treadmill that looks for heart disease. This test can find disease prior to there being a problem.  Menopause can be associated with physical symptoms and risks. Hormone replacement therapy is available to decrease these. You should talk to your caregiver about whether starting or continuing to take hormones is right for you.   Osteoporosis is a disease in which the bones lose minerals and strength as we age. This can result in serious bone fractures. Risk of osteoporosis can be identified using a bone density scan. Women ages 23 and over should discuss this with their caregivers, as should women after menopause who have other risk factors. Ask your caregiver whether you should be taking a calcium supplement and Vitamin D, to reduce the rate of osteoporosis.   Avoid drinking alcohol in excess (more than two drinks per day).  Avoid use of street drugs. Do not share needles with anyone. Ask for professional help if you need assistance or instructions on stopping the use of alcohol, cigarettes, and/or drugs.  Brush your teeth twice a day with fluoride toothpaste, and floss once a day. Good oral hygiene prevents tooth decay and gum disease. The problems can be painful, unattractive, and can cause other health  problems. Visit your dentist for a routine oral and dental check up and preventive care every 6-12 months.   Look at your skin regularly.  Use a mirror to look at your back. Notify your caregivers of changes in moles, especially if there are changes in shapes, colors, a size larger than a pencil eraser, an irregular border, or development of new moles.  Safety:  Use seatbelts 100% of the time, whether driving or as a passenger.  Use safety devices such as hearing protection if you work in environments with loud noise or significant background noise.  Use safety glasses when doing any work that could send debris in to the eyes.  Use a helmet if you ride a bike or motorcycle.  Use appropriate safety gear for contact sports.  Talk to your caregiver about gun safety.  Use sunscreen with a SPF (or skin protection factor) of 15 or greater.  Lighter skinned people are at a greater risk of skin cancer. Don't forget to also wear sunglasses in order to protect your eyes from too much damaging sunlight. Damaging sunlight can accelerate cataract  formation.   Practice safe sex. Use condoms. Condoms are used for birth control and to help reduce the spread of sexually transmitted infections (or STIs).  Some of the STIs are gonorrhea (the clap), chlamydia, syphilis, trichomonas, herpes, HPV (human papilloma virus) and HIV (human immunodeficiency virus) which causes AIDS. The herpes, HIV and HPV are viral illnesses that have no cure. These can result in disability, cancer and death.   Keep carbon monoxide and smoke detectors in your home functioning at all times. Change the batteries every 6 months or use a model that plugs into the wall.   Vaccinations:  Stay up to date with your tetanus shots and other required immunizations. You should have a booster for tetanus every 10 years. Be sure to get your flu shot every year, since 5%-20% of the [place] population comes down with the flu. The flu vaccine changes each year,  so being vaccinated once is not enough. Get your shot in the fall, before the flu season peaks.   Other vaccines to consider:  Human Papilloma Virus or HPV causes cancer of the cervix, and other infections that can be transmitted from person to person. There is a vaccine for HPV, and females should get immunized between the ages of 49 and 54. It requires a series of 3 shots.   Pneumococcal vaccine to protect against certain types of pneumonia.  This is normally recommended for adults age 36 or older.  However, adults younger than 64 years old with certain underlying conditions such as diabetes, heart or lung disease should also receive the vaccine.  Shingles vaccine to protect against Varicella Zoster if you are older than age 79, or younger than 64 years old with certain underlying illness.  Hepatitis A vaccine to protect against a form of infection of the liver by a virus acquired from food.  Hepatitis B vaccine to protect against a form of infection of the liver by a virus acquired from blood or body fluids, particularly if you work in health care.  If you plan to travel internationally, check with your local health department for specific vaccination recommendations.  Cancer Screening:  Breast cancer screening is essential to preventive care for women. All women age 43 and older should perform a breast self-exam every month. At age 56 and older, women should have their caregiver complete a breast exam each year. Women at ages 53 and older should have a mammogram (x-ray film) of the breasts. Your caregiver can discuss how often you need mammograms.    Cervical cancer screening includes taking a Pap smear (sample of cells examined under a microscope) from the cervix (end of the uterus). It also includes testing for HPV (Human Papilloma Virus, which can cause cervical cancer). Screening and a pelvic exam should begin at age 62, or 3 years after a woman becomes sexually active. Screening should  occur every year, with a Pap smear but no HPV testing, up to age 33. After age 2, you should have a Pap smear every 3 years with HPV testing, if no HPV was found previously.   Most routine colon cancer screening begins at the age of 17. On a yearly basis, doctors may provide special easy to use take-home tests to check for hidden blood in the stool. Sigmoidoscopy or colonoscopy can detect the earliest forms of colon cancer and is life saving. These tests use a small camera at the end of a tube to directly examine the colon. Speak to your caregiver about this at  age 33, when routine screening begins (and is repeated every 5 years unless early forms of pre-cancerous polyps or small growths are found).

## 2015-10-20 ENCOUNTER — Ambulatory Visit
Admission: RE | Admit: 2015-10-20 | Discharge: 2015-10-20 | Disposition: A | Discharge status: Home / Self Care | Payer: Commercial Managed Care - HMO | Source: Ambulatory Visit | Attending: Family Medicine | Admitting: Family Medicine

## 2015-10-20 DIAGNOSIS — Z1231 Encounter for screening mammogram for malignant neoplasm of breast: Secondary | ICD-10-CM

## 2015-10-20 DIAGNOSIS — M8580 Other specified disorders of bone density and structure, unspecified site: Secondary | ICD-10-CM

## 2015-10-20 LAB — HEMOGLOBIN A1C
HEMOGLOBIN A1C: 5.9 % — AB (ref <5.7)
MEAN PLASMA GLUCOSE: 123 mg/dL

## 2015-10-20 LAB — CYTOLOGY - PAP

## 2015-10-20 LAB — VITAMIN D 25 HYDROXY (VIT D DEFICIENCY, FRACTURES): VIT D 25 HYDROXY: 28 ng/mL — AB (ref 30–100)

## 2015-10-21 ENCOUNTER — Encounter: Payer: Self-pay | Admitting: Family Medicine

## 2015-10-21 ENCOUNTER — Other Ambulatory Visit: Payer: Self-pay | Admitting: Family Medicine

## 2015-10-21 ENCOUNTER — Ambulatory Visit (INDEPENDENT_AMBULATORY_CARE_PROVIDER_SITE_OTHER): Payer: Commercial Managed Care - HMO | Admitting: Family Medicine

## 2015-10-21 DIAGNOSIS — Z2911 Encounter for prophylactic immunotherapy for respiratory syncytial virus (RSV): Secondary | ICD-10-CM

## 2015-10-21 DIAGNOSIS — E782 Mixed hyperlipidemia: Secondary | ICD-10-CM | POA: Diagnosis not present

## 2015-10-21 DIAGNOSIS — R7989 Other specified abnormal findings of blood chemistry: Secondary | ICD-10-CM

## 2015-10-21 DIAGNOSIS — R7303 Prediabetes: Secondary | ICD-10-CM | POA: Diagnosis not present

## 2015-10-21 DIAGNOSIS — Z23 Encounter for immunization: Secondary | ICD-10-CM | POA: Diagnosis not present

## 2015-10-21 DIAGNOSIS — M8580 Other specified disorders of bone density and structure, unspecified site: Secondary | ICD-10-CM

## 2015-10-21 DIAGNOSIS — E559 Vitamin D deficiency, unspecified: Secondary | ICD-10-CM | POA: Diagnosis not present

## 2015-10-21 DIAGNOSIS — R945 Abnormal results of liver function studies: Secondary | ICD-10-CM

## 2015-10-21 MED ORDER — VITAMIN D (ERGOCALCIFEROL) 1.25 MG (50000 UNIT) PO CAPS: 50000.0000 [IU] | ORAL_CAPSULE | ORAL | 0 refills | Status: DC

## 2015-10-21 NOTE — Patient Instructions (Addendum)
I am sending a vitamin D prescription to your pharmacy and would like for you to take this for the next 8 weeks and then switch back to your daily multi-vitamin. We will recheck this at the 3 month follow up appointment. Also, you need 1,200 mg Calcium daily from sources including diet or vitamins.  Make sure you are getting weight bearing exercises (walking, dancing, aerobics).  We will repeat your liver enzymes, try to limit alcohol intake. Go as scheduled for the MRI of your liver.   Watch your sugar and carbohydrate intake.  Eat a low fat and low cholesterol diet.   We will repeat your hemoglobin A1C and cholesterol at your follow up appointment as well. Come in fasting please.    Prediabetes Eating Plan Prediabetes--also called impaired glucose tolerance or impaired fasting glucose--is a condition that causes blood sugar (blood glucose) levels to be higher than normal. Following a healthy diet can help to keep prediabetes under control. It can also help to lower the risk of type 2 diabetes and heart disease, which are increased in people who have prediabetes. Along with regular exercise, a healthy diet:  Promotes weight loss.  Helps to control blood sugar levels.  Helps to improve the way that the body uses insulin. WHAT DO I NEED TO KNOW ABOUT THIS EATING PLAN?  Use the glycemic index (GI) to plan your meals. The index tells you how quickly a food will raise your blood sugar. Choose low-GI foods. These foods take a longer time to raise blood sugar.  Pay close attention to the amount of carbohydrates in the food that you eat. Carbohydrates increase blood sugar levels.  Keep track of how many calories you take in. Eating the right amount of calories will help you to achieve a healthy weight. Losing about 7 percent of your starting weight can help to prevent type 2 diabetes.  You may want to follow a Mediterranean diet. This diet includes a lot of vegetables, lean meats or fish, whole  grains, fruits, and healthy oils and fats. WHAT FOODS CAN I EAT? Grains Whole grains, such as whole-wheat or whole-grain breads, crackers, cereals, and pasta. Unsweetened oatmeal. Bulgur. Barley. Quinoa. Brown rice. Corn or whole-wheat flour tortillas or taco shells. Vegetables Lettuce. Spinach. Peas. Beets. Cauliflower. Cabbage. Broccoli. Carrots. Tomatoes. Squash. Eggplant. Herbs. Peppers. Onions. Cucumbers. Brussels sprouts. Fruits Berries. Bananas. Apples. Oranges. Grapes. Papaya. Mango. Pomegranate. Kiwi. Grapefruit. Cherries. Meats and Other Protein Sources Seafood. Lean meats, such as chicken and [place] or lean cuts of pork and beef. Tofu. Eggs. Nuts. Beans. Dairy Low-fat or fat-free dairy products, such as yogurt, cottage cheese, and cheese. Beverages Water. Tea. Coffee. Sugar-free or diet soda. Seltzer water. Milk. Milk alternatives, such as soy or almond milk. Condiments Mustard. Relish. Low-fat, low-sugar ketchup. Low-fat, low-sugar barbecue sauce. Low-fat or fat-free mayonnaise. Sweets and Desserts Sugar-free or low-fat pudding. Sugar-free or low-fat ice cream and other frozen treats. Fats and Oils Avocado. Walnuts. Olive oil. The items listed above may not be a complete list of recommended foods or beverages. Contact your dietitian for more options.  WHAT FOODS ARE NOT RECOMMENDED? Grains Refined white flour and flour products, such as bread, pasta, snack foods, and cereals. Beverages Sweetened drinks, such as sweet iced tea and soda. Sweets and Desserts Baked goods, such as cake, cupcakes, pastries, cookies, and cheesecake. The items listed above may not be a complete list of foods and beverages to avoid. Contact your dietitian for more information.   This information is  not intended to replace advice given to you by your health care provider. Make sure you discuss any questions you have with your health care provider.   Document Released: 05/13/2014 Document Reviewed:  05/13/2014 Elsevier Interactive Patient Education 2016 [place].   High Cholesterol High cholesterol refers to having a high level of cholesterol in your blood. Cholesterol is a white, waxy, fat-like protein that your body needs in small amounts. Your liver makes all the cholesterol you need. Excess cholesterol comes from the food you eat. Cholesterol travels in your bloodstream through your blood vessels. If you have high cholesterol, deposits (plaque) may build up on the walls of your blood vessels. This makes the arteries narrower and stiffer. Plaque increases your risk of heart attack and stroke. Work with your health care provider to keep your cholesterol levels in a healthy range. RISK FACTORS Several things can make you more likely to have high cholesterol. These include:   Eating foods high in animal fat (saturated fat) or cholesterol.  Being overweight.  Not getting enough exercise.  Having a family history of high cholesterol. SIGNS AND SYMPTOMS High cholesterol does not cause symptoms. DIAGNOSIS  Your health care provider can do a blood test to check whether you have high cholesterol. If you are older than 20, your health care provider may check your cholesterol every 4-6 years. You may be checked more often if you already have high cholesterol or other risk factors for heart disease. The blood test for cholesterol measures the following:  Bad cholesterol (LDL cholesterol). This is the type of cholesterol that causes heart disease. This number should be less than 100.  Good cholesterol (HDL cholesterol). This type helps protect against heart disease. A healthy level of HDL cholesterol is 60 or higher.  Total cholesterol. This is the combined number of LDL cholesterol and HDL cholesterol. A healthy number is less than 200. TREATMENT  High cholesterol can be treated with diet changes, lifestyle changes, and medicine.   Diet changes may include eating more whole grains,  fruits, vegetables, nuts, and fish. You may also have to cut back on red meat and foods with a lot of added sugar.  Lifestyle changes may include getting at least 40 minutes of aerobic exercise three times a week. Aerobic exercises include walking, biking, and swimming. Aerobic exercise along with a healthy diet can help you maintain a healthy weight. Lifestyle changes may also include quitting smoking.  If diet and lifestyle changes are not enough to lower your cholesterol, your health care provider may prescribe a statin medicine. This medicine has been shown to lower cholesterol and also lower the risk of heart disease. HOME CARE INSTRUCTIONS  Only take over-the-counter or prescription medicines as directed by your health care provider.   Follow a healthy diet as directed by your health care provider. For instance:   Eat chicken (without skin), fish, veal, shellfish, ground [place] breast, and round or loin cuts of red meat.  Do not eat fried foods and fatty meats, such as hot dogs and salami.   Eat plenty of fruits, such as apples.   Eat plenty of vegetables, such as broccoli, potatoes, and carrots.   Eat beans, peas, and lentils.   Eat grains, such as barley, rice, couscous, and bulgur wheat.   Eat pasta without cream sauces.   Use skim or nonfat milk and low-fat or nonfat yogurt and cheeses. Do not eat or drink whole milk, cream, ice cream, egg yolks, and hard cheeses.  Do not eat stick margarine or tub margarines that contain trans fats (also called partially hydrogenated oils).   Do not eat cakes, cookies, crackers, or other baked goods that contain trans fats.   Do not eat saturated tropical oils, such as coconut and palm oil.   Exercise as directed by your health care provider. Increase your activity level with activities such as gardening or walking.   Keep all follow-up appointments.  SEEK MEDICAL CARE IF:  You are struggling to maintain a healthy diet  or weight.  You need help starting an exercise program.  You need help to stop smoking. SEEK IMMEDIATE MEDICAL CARE IF:  You have chest pain.  You have trouble breathing.   This information is not intended to replace advice given to you by your health care provider. Make sure you discuss any questions you have with your health care provider.   Document Released: 12/27/2004 Document Revised: 01/17/2014 Document Reviewed: 10/19/2012 Elsevier Interactive Patient Education 2016 [employer]  Fat and Cholesterol Restricted Diet High levels of fat and cholesterol in your blood may lead to various health problems, such as diseases of the heart, blood vessels, gallbladder, liver, and pancreas. Fats are concentrated sources of energy that come in various forms. Certain types of fat, including saturated fat, may be harmful in excess. Cholesterol is a substance needed by your body in small amounts. Your body makes all the cholesterol it needs. Excess cholesterol comes from the food you eat. When you have high levels of cholesterol and saturated fat in your blood, health problems can develop because the excess fat and cholesterol will gather along the walls of your blood vessels, causing them to narrow. Choosing the right foods will help you control your intake of fat and cholesterol. This will help keep the levels of these substances in your blood within normal limits and reduce your risk of disease. WHAT IS MY PLAN? Your health care provider recommends that you:  Get no more than __________ % of the total calories in your daily diet from fat.  Limit your intake of saturated fat to less than ______% of your total calories each day.  Limit the amount of cholesterol in your diet to less than _________mg per day. WHAT TYPES OF FAT SHOULD I CHOOSE?  Choose healthy fats more often. Choose monounsaturated and polyunsaturated fats, such as olive and canola oil, flaxseeds, walnuts, almonds, and  seeds.  Eat more omega-3 fats. Good choices include salmon, mackerel, sardines, tuna, flaxseed oil, and ground flaxseeds. Aim to eat fish at least two times a week.  Limit saturated fats. Saturated fats are primarily found in animal products, such as meats, butter, and cream. Plant sources of saturated fats include palm oil, palm kernel oil, and coconut oil.  Avoid foods with partially hydrogenated oils in them. These contain trans fats. Examples of foods that contain trans fats are stick margarine, some tub margarines, cookies, crackers, and other baked goods. WHAT GENERAL GUIDELINES DO I NEED TO FOLLOW? These guidelines for healthy eating will help you control your intake of fat and cholesterol:  Check food labels carefully to identify foods with trans fats or high amounts of saturated fat.  Fill one half of your plate with vegetables and green salads.  Fill one fourth of your plate with whole grains. Look for the word "whole" as the first word in the ingredient list.  Fill one fourth of your plate with lean protein foods.  Limit fruit to two servings a day. Choose  fruit instead of juice.  Eat more foods that contain soluble fiber. Examples of foods that contain this type of fiber are apples, broccoli, carrots, beans, peas, and barley. Aim to get 20-30 g of fiber per day.  Eat more home-cooked food and less restaurant, buffet, and fast food.  Limit or avoid alcohol.  Limit foods high in starch and sugar.  Limit fried foods.  Cook foods using methods other than frying. Baking, boiling, grilling, and broiling are all great options.  Lose weight if you are overweight. Losing just 5-10% of your initial body weight can help your overall health and prevent diseases such as diabetes and heart disease. WHAT FOODS CAN I EAT? Grains Whole grains, such as whole wheat or whole grain breads, crackers, cereals, and pasta. Unsweetened oatmeal, bulgur, barley, quinoa, or brown rice. Corn or  whole wheat flour tortillas. Vegetables Fresh or frozen vegetables (raw, steamed, roasted, or grilled). Green salads. Fruits All fresh, canned (in natural juice), or frozen fruits. Meat and Other Protein Products Ground beef (85% or leaner), grass-fed beef, or beef trimmed of fat. Skinless chicken or [place]. Ground chicken or [place]. Pork trimmed of fat. All fish and seafood. Eggs. Dried beans, peas, or lentils. Unsalted nuts or seeds. Unsalted canned or dry beans. Dairy Low-fat dairy products, such as skim or 1% milk, 2% or reduced-fat cheeses, low-fat ricotta or cottage cheese, or plain low-fat yogurt. Fats and Oils Tub margarines without trans fats. Light or reduced-fat mayonnaise and salad dressings. Avocado. Olive, canola, sesame, or safflower oils. Natural peanut or almond butter (choose ones without added sugar and oil). The items listed above may not be a complete list of recommended foods or beverages. Contact your dietitian for more options. WHAT FOODS ARE NOT RECOMMENDED? Grains White bread. White pasta. White rice. Cornbread. Bagels, pastries, and croissants. Crackers that contain trans fat. Vegetables White potatoes. Corn. Creamed or fried vegetables. Vegetables in a cheese sauce. Fruits Dried fruits. Canned fruit in light or heavy syrup. Fruit juice. Meat and Other Protein Products Fatty cuts of meat. Ribs, chicken wings, bacon, sausage, bologna, salami, chitterlings, fatback, hot dogs, bratwurst, and packaged luncheon meats. Liver and organ meats. Dairy Whole or 2% milk, cream, half-and-half, and cream cheese. Whole milk cheeses. Whole-fat or sweetened yogurt. Full-fat cheeses. Nondairy creamers and whipped toppings. Processed cheese, cheese spreads, or cheese curds. Sweets and Desserts Corn syrup, sugars, honey, and molasses. Candy. Jam and jelly. Syrup. Sweetened cereals. Cookies, pies, cakes, donuts, muffins, and ice cream. Fats and Oils Butter, stick margarine, lard,  shortening, ghee, or bacon fat. Coconut, palm kernel, or palm oils. Beverages Alcohol. Sweetened drinks (such as sodas, lemonade, and fruit drinks or punches). The items listed above may not be a complete list of foods and beverages to avoid. Contact your dietitian for more information.   This information is not intended to replace advice given to you by your health care provider. Make sure you discuss any questions you have with your health care provider.   Document Released: 12/27/2004 Document Revised: 01/17/2014 Document Reviewed: 03/27/2013 Elsevier Interactive Patient Education [employer].

## 2015-10-21 NOTE — Progress Notes (Signed)
Subjective:    Patient ID: Alexandra Davies, female    DOB: [birth year], 65 y.o.   MRN: [id number]  HPI Chief Complaint  Patient presents with  . dicuss lab results    discuss lab results   She is here for follow up on abnormal labs.  Prediabetes- A1C is 5.9. Denies history of prediabetes. States she has not been eating healthy or exercising but plans to start. Her husband has diabetes and she is aware of the disease and management.  Mixed hyperlipidemia- did not take medication in past. states she has controlled this with diet and exercise in the past and her cholesterol had improved when her previous PCP checked labs.  Elevated LFTs- she has a history of liver mass and we are following this. Has an MRI scheduled.   DEXA shows osteopenia but not worsening since 2011.  Vitamin D- has not been taking this for several months. She started back on vitamin D 2 weeks ago. Does not know how many units she is taking.  Calcium- does not know how much she is getting in her diet. Is not taking a supplement.  Denies history of fracture. Does not smoke. Drinks 2-3 glasses of wine most nights.   Would like a shingles vaccine today.   Denies fever, chills, headache, chest pain, DOE, abdominal pain, N/V/D, GU symptoms.   Reviewed allergies, medications, past medical,  family, and social history.    Review of Systems Pertinent positives and negatives in the history of present illness.     Objective:   Physical Exam There were no vitals taken for this visit.  Alert and oriented and in no acute distress. Not otherwise examined.      Assessment & Plan:  Prediabetes  Mixed hyperlipidemia  Vitamin D deficiency  Osteopenia determined by x-ray  Elevated LFTs  Need for shingles vaccine  Discussed that her hemoglobin A1C places her in the prediabetes range. Discussed lifestyle modifications to prevent this from worsening such as reducing carbohydrate and sugar intake and getting at least 150  minutes of physical activity per week. She states she is aware of diabetes as a disease because her husband has DM. She plans to start eating his diabetic diet with him.  Cholesterol- history of elevated cholesterol and reports she has never taken medication and has controlled this with diet and exercise. Plan to have her eat a low fat and low cholesterol diet and repeat her lipids in 3-4 months. She does not want to start medication at this point and I am ok with this.  Elevated LFTs- recommend that based on her history of benign liver mass that we are following and that her liver enzymes are now elevated that she stop drinking alcohol or at least cut back significantly. She states her father was an alcoholic and she thinks he had cirrhosis so she states she is aware of alcohol effects on the liver.  History of vitamin D deficiency and osteopenia. Her bone density does not show significant worsening. She plans to start weight bearing exercises, get at least 1,200 mg of calcium daily from all sources.  Plan to have her take 50,000 IU of vitamin D once weekly for the next 8 weeks and then she will go back to her daily vitamin D of 800 IU. Will repeat vitamin D level in 3 months.  Discussed that her pap smear shows some atrophy but no HPV or abnormal cells.  Shingles vaccine given per patient request. She called her insurance  company and is aware of co-pay.  Spent at least 25 minutes with patient and at all of it was in counseling regarding multiple chronic health conditions and management.  She will follow up in 3-4 months fasting and we will repeat vitamin D, Liver functions tests, lipids, and hemoglobin A1C.

## 2015-11-02 ENCOUNTER — Ambulatory Visit
Admission: RE | Admit: 2015-11-02 | Discharge: 2015-11-02 | Disposition: A | Discharge status: Home / Self Care | Payer: Commercial Managed Care - HMO | Source: Ambulatory Visit | Attending: Family Medicine | Admitting: Family Medicine

## 2015-11-02 DIAGNOSIS — K769 Liver disease, unspecified: Secondary | ICD-10-CM

## 2015-11-02 MED ORDER — GADOBENATE DIMEGLUMINE 529 MG/ML IV SOLN
15.0000 mL | Freq: Once | INTRAVENOUS | Status: AC | PRN
  Administered 2015-11-02: 15 mL via INTRAVENOUS

## 2015-12-01 ENCOUNTER — Other Ambulatory Visit: Payer: Self-pay | Admitting: Family Medicine

## 2016-02-03 ENCOUNTER — Other Ambulatory Visit: Payer: Self-pay | Admitting: Family Medicine

## 2016-02-04 ENCOUNTER — Encounter: Payer: Self-pay | Admitting: Family Medicine

## 2016-02-04 ENCOUNTER — Ambulatory Visit (INDEPENDENT_AMBULATORY_CARE_PROVIDER_SITE_OTHER): Payer: Commercial Managed Care - HMO | Admitting: Family Medicine

## 2016-02-04 VITALS — BP 120/78 | HR 68 | Wt 163.0 lb

## 2016-02-04 DIAGNOSIS — I10 Essential (primary) hypertension: Secondary | ICD-10-CM

## 2016-02-04 DIAGNOSIS — R945 Abnormal results of liver function studies: Secondary | ICD-10-CM

## 2016-02-04 DIAGNOSIS — E782 Mixed hyperlipidemia: Secondary | ICD-10-CM

## 2016-02-04 DIAGNOSIS — R160 Hepatomegaly, not elsewhere classified: Secondary | ICD-10-CM | POA: Diagnosis not present

## 2016-02-04 DIAGNOSIS — R7989 Other specified abnormal findings of blood chemistry: Secondary | ICD-10-CM

## 2016-02-04 DIAGNOSIS — E559 Vitamin D deficiency, unspecified: Secondary | ICD-10-CM | POA: Diagnosis not present

## 2016-02-04 DIAGNOSIS — Z8619 Personal history of other infectious and parasitic diseases: Secondary | ICD-10-CM

## 2016-02-04 DIAGNOSIS — R7303 Prediabetes: Secondary | ICD-10-CM | POA: Diagnosis not present

## 2016-02-04 LAB — LIPID PANEL
Cholesterol: 207 mg/dL — ABNORMAL HIGH (ref <200)
HDL: 59 mg/dL (ref >50)
LDL CALC: 124 mg/dL — AB (ref <100)
Total CHOL/HDL Ratio: 3.5 Ratio (ref <5.0)
Triglycerides: 118 mg/dL (ref <150)
VLDL: 24 mg/dL (ref <30)

## 2016-02-04 LAB — CBC WITH DIFFERENTIAL/PLATELET
Basophils Absolute: 0 cells/uL (ref 0–200)
Basophils Relative: 0 %
EOS PCT: 3 %
Eosinophils Absolute: 270 cells/uL (ref 15–500)
HCT: 47.5 % — ABNORMAL HIGH (ref 35.0–45.0)
Hemoglobin: 16 g/dL — ABNORMAL HIGH (ref 11.7–15.5)
LYMPHS PCT: 30 %
Lymphs Abs: 2700 cells/uL (ref 850–3900)
MCH: 30.8 pg (ref 27.0–33.0)
MCHC: 33.7 g/dL (ref 32.0–36.0)
MCV: 91.3 fL (ref 80.0–100.0)
MPV: 10.5 fL (ref 7.5–12.5)
Monocytes Absolute: 720 cells/uL (ref 200–950)
Monocytes Relative: 8 %
NEUTROS PCT: 59 %
Neutro Abs: 5310 cells/uL (ref 1500–7800)
PLATELETS: 252 10*3/uL (ref 140–400)
RBC: 5.2 MIL/uL — ABNORMAL HIGH (ref 3.80–5.10)
RDW: 12.8 % (ref 11.0–15.0)
WBC: 9 10*3/uL (ref 4.0–10.5)

## 2016-02-04 LAB — COMPREHENSIVE METABOLIC PANEL
ALT: 36 U/L — ABNORMAL HIGH (ref 6–29)
AST: 28 U/L (ref 10–35)
Albumin: 4.6 g/dL (ref 3.6–5.1)
Alkaline Phosphatase: 79 U/L (ref 33–130)
BILIRUBIN TOTAL: 0.8 mg/dL (ref 0.2–1.2)
BUN: 15 mg/dL (ref 7–25)
CO2: 24 mmol/L (ref 20–31)
CREATININE: 0.56 mg/dL (ref 0.50–0.99)
Calcium: 9.9 mg/dL (ref 8.6–10.4)
Chloride: 96 mmol/L — ABNORMAL LOW (ref 98–110)
GLUCOSE: 85 mg/dL (ref 65–99)
Potassium: 4 mmol/L (ref 3.5–5.3)
SODIUM: 139 mmol/L (ref 135–146)
Total Protein: 7.8 g/dL (ref 6.1–8.1)

## 2016-02-04 LAB — ACUTE HEP PANEL AND HEP B SURFACE AB
HCV AB: NEGATIVE
Hep A IgM: NONREACTIVE
Hep B C IgM: NONREACTIVE
Hep B S Ab: NEGATIVE
Hepatitis B Surface Ag: NEGATIVE

## 2016-02-04 NOTE — Progress Notes (Signed)
   Subjective:    Patient ID: Alexandra Davies, female    DOB: April 22, 1950, 66 y.o.   MRN: [id number]  HPI Chief Complaint  Patient presents with  . follow-up    follow-up- fasting   She is here for a fasting follow up on multiple chronic health conditions.  No concerns or issues today and states she has been feeling well.  States she is eating better and reduced her wine intake.  Plans to join a gym. Not currently exercising.  States she had hepatitis in her teens when she lived in [place]. Does not know which one.   States she is followed by GI at [facility] and goes annually.  She is being followed by for a benign liver mass and persistently elevated LFTs.  She does have fatty liver disease.  Recent MRI showed that the mass is slightly smaller and stable.   History of vitamin D deficiency. She is taking a vitamin D supplement.    HTN: taking daily medication and is not checking BP at home. No concerns.   She is prediabetic. We are keeping an eye on this.  History of hyperlipidemia. Did not want to take cholesterol medication and is fasting today so we can recheck this.   Denies fever, chills, fatigue, dizziness, chest pain, palpitations, shortness of breath, orthopnea, abdominal pain, GI or GU symptoms.   Reviewed allergies, medications, past medical, surgical,  and social history.    Review of Systems Pertinent positives and negatives in the history of present illness.     Objective:   Physical Exam BP 120/78   Pulse 68   Wt 163 lb (73.9 kg)   BMI 30.55 kg/m   Alert and oriented and in no acute distress. Not otherwise examined.       Assessment & Plan:  Prediabetes - Plan: CBC with Differential/Platelet, Comprehensive metabolic panel, Hemoglobin A1c  Essential hypertension - Plan: CBC with Differential/Platelet, Comprehensive metabolic panel  Elevated LFTs - Plan: Comprehensive metabolic panel, Acute Hep Panel & Hep B Surface Ab, Hepatitis C antibody, Hepatitis B  core antibody, IgM  Mixed hyperlipidemia - Plan: Lipid panel  Vitamin D deficiency - Plan: VITAMIN D 25 Hydroxy (Vit-D Deficiency, Fractures)  Liver mass - Plan: Acute Hep Panel & Hep B Surface Ab, Hepatitis C antibody, Hepatitis B core antibody, IgM  History of hepatitis - Plan: Acute Hep Panel & Hep B Surface Ab, Hepatitis C antibody, Hepatitis B core antibody, IgM  Congratulated her on making healthy lifestyle changes such as healthy diet and weight loss. Plan to check hemoglobin A1C.  Blood pressure is within goal range. Continue on current medication.  She is fasting and I will check lipids today. Consider adding a statin.  Plan to check vitamin D level and adjust daily dose as needed.  She will continue to see GI annually and we will monitor benign liver mass and LFTs. Plan to rule out hepatitis.  Follow up pending labs or in 3 months.

## 2016-02-05 LAB — VITAMIN D 25 HYDROXY (VIT D DEFICIENCY, FRACTURES): Vit D, 25-Hydroxy: 36 ng/mL (ref 30–100)

## 2016-02-06 LAB — HEMOGLOBIN A1C
Hgb A1c MFr Bld: 5.6 % (ref <5.7)
Mean Plasma Glucose: 114 mg/dL

## 2016-04-29 ENCOUNTER — Other Ambulatory Visit: Payer: Self-pay | Admitting: Family Medicine

## 2016-05-11 ENCOUNTER — Ambulatory Visit (INDEPENDENT_AMBULATORY_CARE_PROVIDER_SITE_OTHER): Payer: Medicare Other | Admitting: Family Medicine

## 2016-05-11 ENCOUNTER — Encounter: Payer: Self-pay | Admitting: Family Medicine

## 2016-05-11 VITALS — BP 120/72 | HR 74 | Temp 97.7°F | Wt 160.6 lb

## 2016-05-11 DIAGNOSIS — R300 Dysuria: Secondary | ICD-10-CM

## 2016-05-11 DIAGNOSIS — R350 Frequency of micturition: Secondary | ICD-10-CM | POA: Diagnosis not present

## 2016-05-11 LAB — POCT URINALYSIS DIPSTICK
Bilirubin, UA: NEGATIVE
GLUCOSE UA: NEGATIVE
KETONES UA: NEGATIVE
Leukocytes, UA: NEGATIVE
Nitrite, UA: NEGATIVE
Protein, UA: NEGATIVE
SPEC GRAV UA: 1.015 (ref 1.010–1.025)
UROBILINOGEN UA: 1 U/dL
pH, UA: 6.5 (ref 5.0–8.0)

## 2016-05-11 MED ORDER — SULFAMETHOXAZOLE-TRIMETHOPRIM 800-160 MG PO TABS: 1.0000 | ORAL_TABLET | Freq: Two times a day (BID) | ORAL | 0 refills | Status: DC

## 2016-05-11 NOTE — Progress Notes (Signed)
Subjective: Chief Complaint  Patient presents with  . burning with urination    yesterday woke up and had cloudy and had an odor, frequency,burning     Alexandra Davies is a 66 y.o. female who complains of possible urinary tract infection.  She has had symptoms for 2 days.  Symptoms include cloudy, malodorous urine, urinary frequency, dysuria. Patient denies back pain, fever, stomach ache and vaginal discharge.  Last UTI was last year.  Using nothing for current symptoms.    Patient does not have a history of recurrent UTI. Patient does not have a history of pyelonephritis.  No other aggravating or relieving factors.  No other c/o.  Past Medical History:  Diagnosis Date  . Anxiety    only takes when travels  . Depression   . Elevated LFTs   . GERD (gastroesophageal reflux disease)   . Hiatal hernia   . Hypertension   . Liver lesion   . Osteopenia   . Prediabetes   . Vitamin D deficiency     ROS as in subjective  Reviewed allergies, medications, past medical, surgical, and social history.    Objective: Vitals:   05/11/16 1515  BP: 120/72  Pulse: 74  Temp: 97.7 F (36.5 C)    General appearance: alert, no distress, WD/WN, female Abdomen: +bs, soft, non tender, non distended, no masses, no hepatomegaly, no splenomegaly, no bruits Back: no CVA tenderness GU: declined      Laboratory:  Urine dipstick: trace for hemoglobin.       Assessment: Dysuria - Plan: Urinalysis Dipstick  Urinary frequency - Plan: Urinalysis Dipstick    Plan: Discussed symptoms, diagnosis, possible complications, and usual course of illness.  UA is not classic for acute cystitis but will treat based on symptoms.  Advised increased water intake, can use OTC Tylenol for pain.    Advised to follow up if not back to baseline after completing the antibiotic or if she gets worse.     Urine culture was not sent.

## 2016-05-11 NOTE — Patient Instructions (Signed)
Let me know if you are not back to baseline after completing the antibiotic. Increase water intake.

## 2016-08-25 ENCOUNTER — Other Ambulatory Visit: Payer: Self-pay | Admitting: Family Medicine

## 2016-09-05 ENCOUNTER — Other Ambulatory Visit: Payer: Self-pay | Admitting: Family Medicine

## 2016-11-08 ENCOUNTER — Encounter: Payer: Self-pay | Admitting: Family Medicine

## 2016-11-08 ENCOUNTER — Ambulatory Visit (INDEPENDENT_AMBULATORY_CARE_PROVIDER_SITE_OTHER): Payer: Medicare Other | Admitting: Family Medicine

## 2016-11-08 VITALS — BP 124/74 | HR 71 | Temp 98.1°F | Resp 16 | Ht 61.0 in | Wt 160.2 lb

## 2016-11-08 DIAGNOSIS — J019 Acute sinusitis, unspecified: Secondary | ICD-10-CM

## 2016-11-08 MED ORDER — AMOXICILLIN 875 MG PO TABS: 875.0000 mg | ORAL_TABLET | Freq: Two times a day (BID) | ORAL | 0 refills | Status: DC

## 2016-11-08 NOTE — Progress Notes (Signed)
Chief Complaint  Patient presents with  . sick    sick - week and half ago, took a trip last week and been taking otc meds an no relief. headache, some ear pain, drainage, dark yellowish green mucous, low grade fever, cough    Subjective:  Alexandra Davies is a 66 y.o. female who presents for possible sinus infection.  Symptoms include a 2 week history of gradually worsening URI symptoms including facial pressure, purulent nasal drainage, post nasal drainage, low grade fever, sore throat and cough .  Denies fever, chills, chest pain, shortness of breath, wheezing, abdominal pain, N/V/D.   Past history is significant for no history of pneumonia or bronchitis. Patient is a non-smoker.  Using Mucinex, antihistamine, tylenol, aspirin 81 mg  for symptoms. Unknown sick contacts- recent travel to [place] .  No other aggravating or relieving factors.  No other c/o.  ROS as in subjective   Objective: Vitals:   11/08/16 1150  BP: 124/74  Pulse: 71  Resp: 16  Temp: 98.1 F (36.7 C)  SpO2: 95%    General appearance: Alert, WD/WN, no distress                             Skin: warm, no rash                           Head: + maxillary L>R sinus tenderness,                            Eyes: conjunctiva normal, corneas clear, PERRLA                            Ears: pearly TMs, external ear canals normal                          Nose: septum midline, turbinates swollen, with erythema and thick discharge             Mouth/throat: MMM, tongue normal, mild pharyngeal erythema, no edema or exudate                           Neck: supple, no adenopathy, no thyromegaly, nontender                          Heart: RRR, normal S1, S2, no murmurs                         Lungs: CTA bilaterally, no wheezes, rales, or rhonchi       Assessment and Plan: Acute sinusitis with symptoms > 10 days - Plan: amoxicillin (AMOXIL) 875 MG tablet  Prescription sent for Amoxil.  Can use OTC Mucinex for congestion.  Tylenol or  Ibuprofen OTC for fever and malaise.  Discussed symptomatic relief, nasal saline flush, salt water gargles, and call or return if worse or not back to baseline after finishing antibiotic.

## 2016-11-10 ENCOUNTER — Other Ambulatory Visit: Payer: Self-pay | Admitting: Family Medicine

## 2016-11-10 DIAGNOSIS — Z1231 Encounter for screening mammogram for malignant neoplasm of breast: Secondary | ICD-10-CM

## 2016-11-14 ENCOUNTER — Encounter: Payer: Self-pay | Admitting: Family Medicine

## 2016-11-14 DIAGNOSIS — K760 Fatty (change of) liver, not elsewhere classified: Secondary | ICD-10-CM | POA: Insufficient documentation

## 2016-11-14 NOTE — Progress Notes (Signed)
Alexandra Davies is a 66 y.o. female who presents for annual wellness visit and follow-up on chronic medical conditions.  She has the following concerns:  Recent sinusitis diagnosis - symptoms are improving. Doing well on Amoxil.  Omeprazole daily. She has symptoms of reflux if she does not take the medication once daily.  Vitamin D deficiency in past. Is not taking vitamin D.  Diet is rich in calcium.   Complains of intermittent right posterior shoulder pain over the past year or longer. States pain is not worsening. Denies injury or history of surgery. States pain does not radiate and occurs mainly when she has her right arm over her head or occasionally when she is lying on her right side and pushes off. Pain is improved with rest. States this issue is not preventing her from doing her daily activities. She does notice pain when dancing with her husband who is much taller than her and she has her arms reaching upward. She has not been taking medication for this or using ice or heat. Denies numbness, tingling or weakness.   History of benign liver lesion that needs close follow up.  States she saw an oncologist 10 or so years ago to have an evaluation regarding the liver lesion. States she had MRI every year for several years and then was told this could be every 2 years. Last MRI was fall 2017 and lesion was stable and even slightly smaller.  Severe fatty liver on MRI States she has been cutting back on carbohydrates. She is drinking a glass of wine per night still.  Reports having hepatitis A at age 29   HTN- well controlled and no concerns regarding medication. Doing well per patient.   History of cardiology visit, [facility], 30 years ago per patient. States benign findings and no follow up recommended.      Immunization History  Administered Date(s) Administered  . Influenza,inj,Quad PF,6+ Mos 09/17/2014, 10/19/2015  . Influenza-Unspecified 10/30/2016  . Pneumococcal Conjugate-13  11/15/2016  . Tdap 01/16/2009  . Zoster 10/21/2015   Last Pap smear: 2017 Last mammogram: appt for 12/07/16 Last colonoscopy: 2016 Last DEXA: 2017 Dentist: Dr. Judge Stall: [facility] opthomathology in December 2018 Exercise: nothing specific   Other doctors caring for patient include: Dermatology- Joseph Pierini- [facility] GI Dr. Paulita Fujita - sees him every 5 years     Depression screen:  See questionnaire below.  Depression screen [facility] 2/9 11/15/2016 10/19/2015 09/17/2014  Decreased Interest 0 0 0  Down, Depressed, Hopeless 0 0 0  PHQ - 2 Score 0 0 0    Fall Risk Screen: see questionnaire below. Fall Risk  11/15/2016 10/19/2015 09/17/2014  Falls in the past year? No No No  Risk for fall due to : - - History of fall(s)    ADL screen:  See questionnaire below Functional Status Survey: Is the patient deaf or have difficulty hearing?: No Does the patient have difficulty seeing, even when wearing glasses/contacts?: No Does the patient have difficulty concentrating, remembering, or making decisions?: No Does the patient have difficulty walking or climbing stairs?: No Does the patient have difficulty dressing or bathing?: No Does the patient have difficulty doing errands alone such as visiting a doctor's office or shopping?: No   End of Life Discussion:  Patient does not have a living will and medical power of attorney.   Review of Systems Constitutional: -fever, -chills, -sweats, -unexpected weight change, -anorexia, -fatigue Allergy: +sneezing, -itching, + nasal congestion Dermatology: denies changing moles, rash,  lumps, new worrisome lesions ENT: -runny nose, -ear pain, -sore throat, -hoarseness, -sinus pain, -teeth pain, -tinnitus, -hearing loss Cardiology:  -chest pain, -palpitations, -edema, -orthopnea, -paroxysmal nocturnal dyspnea Respiratory: -cough, -shortness of breath, -dyspnea on exertion, -wheezing, -hemoptysis Gastroenterology: -abdominal pain, -nausea,  -vomiting, -diarrhea, -constipation, -blood in stool, -changes in bowel movement, -dysphagia Hematology: -bleeding or bruising problems Musculoskeletal: -arthralgias, -myalgias, -joint swelling, -back pain, -neck pain, -cramping, -gait changes Ophthalmology: -vision changes, -eye redness, -itching, -discharge Urology: -dysuria, -difficulty urinating, -hematuria, -urinary frequency, -urgency, incontinence Neurology: -headache, -weakness, -tingling, -numbness, -speech abnormality, -memory loss, -falls, -dizziness Psychology:  -depressed mood, -agitation, -sleep problems   PHYSICAL EXAM:  BP 138/82   Pulse 65   Ht 5\' 1"  (1.549 m)   Wt 157 lb 9.6 oz (71.5 kg)   BMI 29.78 kg/m   General Appearance: Alert, cooperative, no distress, appears stated age Head: Normocephalic, without obvious abnormality, atraumatic Eyes: PERRL, conjunctiva/corneas clear, EOM's intact, fundi benign Ears: Normal TM's and external ear canals Nose: Nares normal, mucosa normal, no drainage or sinus tenderness Throat: Lips, mucosa, and tongue normal; teeth and gums normal Neck: Supple, no lymphadenopathy; thyroid: no enlargement/tenderness/nodules; no carotid bruit or JVD Back: Spine nontender, no curvature, ROM normal, no CVA tenderness Lungs: Clear to auscultation bilaterally without wheezes, rales or ronchi; respirations unlabored Chest Wall: No tenderness or deformity Heart: Regular rate and rhythm, S1 and S2 normal, no murmur, rub or gallop Breast Exam: No tenderness, masses, or nipple discharge or inversion. No axillary lymphadenopathy Abdomen: Soft, non-tender, nondistended, normoactive bowel sounds, no masses, no hepatosplenomegaly Genitalia: declines. Pap up to date.  Extremities: No clubbing, cyanosis or edema. Right shoulder with normal ROM, strength. Tenderness over the posterior acromion. Negative empty can test, negative Neers and Hawkins.  Pulses: 2+ and symmetric all extremities Skin: Skin color,  texture, turgor normal, no rashes or lesions Lymph nodes: Cervical, supraclavicular, and axillary nodes normal Neurologic: CNII-XII intact, normal strength, sensation and gait; reflexes 2+ and symmetric throughout Psych: Normal mood, affect, hygiene and grooming.  ASSESSMENT/PLAN: Medicare welcome visit - Plan: POCT Urinalysis DIP (Proadvantage Device), EKG 12-Lead  Osteopenia determined by x-ray  Prediabetes - Plan: TSH, Hemoglobin A1c  Mixed hyperlipidemia - Plan: Lipid panel  Liver tumor (benign)  Essential hypertension - Plan: CBC with Differential/Platelet, Comprehensive metabolic panel, Lipid panel  Gastroesophageal reflux disease, esophagitis presence not specified  Elevated LFTs - Plan: Comprehensive metabolic panel  Hepatic steatosis - Plan: Comprehensive metabolic panel  Chronic right shoulder pain  Vitamin D deficiency - Plan: VITAMIN D 25 Hydroxy (Vit-D Deficiency, Fractures)  Routine general medical examination at a health care facility - Plan: CBC with Differential/Platelet, Comprehensive metabolic panel, TSH, Lipid panel  Hematuria, unspecified type - Plan: Urinalysis, microscopic only  Abnormal ECG - Plan: ECHOCARDIOGRAM COMPLETE  Screening ECG is abnormal. NSR with normal rate. Q wave present. Incomplete RBBB. Plan to order echo. Discussed with Dr. Redmond School and he agrees with plan of care.  Discussed cardiac risk factors.  UA trace of blood. Sent for microscopy.  Reviewed MRI for liver lesion and fatty liver from 2017 with patient. Discussed in depth the risks of severe fatty liver disease and continued use of alcohol. Recommend she stop alcohol use and cut back on carbohydrates to help with weight loss. Also encouraged her to increase physical activity. She is now a member of [employer].  Discussed checking liver function testing and will determine need to refer to liver specialist or monitor from a primary care standpoint.  History of  vitamin D  deficiency- will check vitamin D level and start her back on vitamin D as needed.  She will continue getting adequate calcium in her diet.  BP is close to goal. She will check her BP outside of here and let me know if her BP is not at goal on a regular basis.  Discussed diet and exercise for HTN. Continue on current medication. She denies any issues with this.  She will continue on omeprazole but try to use this less often than daily if tolerated. Discussed lifestyle modifications.  Plan to check Hgb A1c due to prediabetes history.  Fasting lipids ordered.  Flu shot given.  Prevnar 13 given per guidelines and counseling done.  Recommend she and her husband consider filling out advance directives.  Shoulder exam is suspicious for rotator cuff tendiopathy. Discuss options of XR or having her see Dr. Redmond School for potential steroid injection. She would like to hold off on these options for now and if her pain becomes more bothersome she will let me know.  Follow up pending labs.    Discussed monthly self breast exams and yearly mammograms; at least 30 minutes of aerobic activity at least 5 days/week and weight-bearing exercise 2x/week; proper sunscreen use reviewed; healthy diet, including goals of calcium and vitamin D intake and alcohol recommendations (less than or equal to 1 drink/day) reviewed; regular seatbelt use; changing batteries in smoke detectors.  Immunization recommendations discussed.  Colonoscopy recommendations reviewed   Medicare Attestation I have personally reviewed: The patient's medical and social history Their use of alcohol, tobacco or illicit drugs Their current medications and supplements The patient's functional ability including ADLs,fall risks, home safety risks, cognitive, and hearing and visual impairment Diet and physical activities Evidence for depression or mood disorders  The patient's weight, height, and BMI have been recorded in the chart.  I have made  referrals, counseling, and provided education to the patient based on review of the above and I have provided the patient with a written personalized care plan for preventive services.     Harland Dingwall, NP-C   11/15/2016

## 2016-11-15 ENCOUNTER — Encounter: Payer: Self-pay | Admitting: Internal Medicine

## 2016-11-15 ENCOUNTER — Telehealth: Payer: Self-pay | Admitting: Internal Medicine

## 2016-11-15 ENCOUNTER — Encounter: Payer: Self-pay | Admitting: Family Medicine

## 2016-11-15 ENCOUNTER — Ambulatory Visit (INDEPENDENT_AMBULATORY_CARE_PROVIDER_SITE_OTHER): Payer: Medicare Other | Admitting: Family Medicine

## 2016-11-15 VITALS — BP 138/82 | HR 65 | Ht 61.0 in | Wt 157.6 lb

## 2016-11-15 DIAGNOSIS — Z0000 Encounter for general adult medical examination without abnormal findings: Secondary | ICD-10-CM

## 2016-11-15 DIAGNOSIS — M25511 Pain in right shoulder: Secondary | ICD-10-CM

## 2016-11-15 DIAGNOSIS — I10 Essential (primary) hypertension: Secondary | ICD-10-CM

## 2016-11-15 DIAGNOSIS — E559 Vitamin D deficiency, unspecified: Secondary | ICD-10-CM

## 2016-11-15 DIAGNOSIS — E782 Mixed hyperlipidemia: Secondary | ICD-10-CM

## 2016-11-15 DIAGNOSIS — Z23 Encounter for immunization: Secondary | ICD-10-CM

## 2016-11-15 DIAGNOSIS — K760 Fatty (change of) liver, not elsewhere classified: Secondary | ICD-10-CM

## 2016-11-15 DIAGNOSIS — R319 Hematuria, unspecified: Secondary | ICD-10-CM | POA: Diagnosis not present

## 2016-11-15 DIAGNOSIS — R9431 Abnormal electrocardiogram [ECG] [EKG]: Secondary | ICD-10-CM

## 2016-11-15 DIAGNOSIS — R7303 Prediabetes: Secondary | ICD-10-CM

## 2016-11-15 DIAGNOSIS — R945 Abnormal results of liver function studies: Secondary | ICD-10-CM

## 2016-11-15 DIAGNOSIS — M8580 Other specified disorders of bone density and structure, unspecified site: Secondary | ICD-10-CM

## 2016-11-15 DIAGNOSIS — D134 Benign neoplasm of liver: Secondary | ICD-10-CM

## 2016-11-15 DIAGNOSIS — G8929 Other chronic pain: Secondary | ICD-10-CM

## 2016-11-15 DIAGNOSIS — R7989 Other specified abnormal findings of blood chemistry: Secondary | ICD-10-CM

## 2016-11-15 DIAGNOSIS — K219 Gastro-esophageal reflux disease without esophagitis: Secondary | ICD-10-CM

## 2016-11-15 LAB — POCT URINALYSIS DIP (PROADVANTAGE DEVICE)
Bilirubin, UA: NEGATIVE
GLUCOSE UA: NEGATIVE mg/dL
Ketones, POC UA: NEGATIVE mg/dL
Leukocytes, UA: NEGATIVE
NITRITE UA: NEGATIVE
PH UA: 6 (ref 5.0–8.0)
Protein Ur, POC: NEGATIVE mg/dL
SPECIFIC GRAVITY, URINE: 1.015
UUROB: NEGATIVE

## 2016-11-15 NOTE — Telephone Encounter (Signed)
Echocardiogram for Friday 11/18/16 @ 2:00pm at [facility] on [address].  Address: [address], [place], [place] [address]  Phone: [phone]   Case # [id number] for [facility]- no prior auth needed

## 2016-11-15 NOTE — Telephone Encounter (Signed)
No prior auth needed for ECHO.

## 2016-11-15 NOTE — Patient Instructions (Signed)
MEDICARE PREVENTATIVE SERVICES (FEMALE) AND PERSONALIZED PLAN for Alexandra Davies November 15, 2016  CONDITIONS OR RISKS IDENTIFIED TODAY: Need advance directives.    SPECIFIC RECOMMENDATIONS: Stop alcohol.  Cut back on carbohydrates.  Increase physical activity.   Influenza vaccine: up to date Pneumococcal vaccine: Prevnar 13 given today Shingles vaccine: Zostavax up to date  Tdap vaccine: up to date Colonoscopy: 2016 Mammogram: upcoming appointment  Pap smear: 2017   Return pending labs or in 6 months.    GENERAL RECOMMENDATIONS FOR GOOD HEALTH:  Supplements:  . Take a daily baby Aspirin 81mg  at bedtime for heart health unless you have a history of gastrointestinal bleed, allergy to aspirin, or are already taking higher dose Aspirin or other antiplatelet or blood thinner medication.   . Consume 1200 mg of Calcium daily through dietary calcium or supplement if you are female age 39 or older, or men 64 and older.   Men aged 67-70 should consume 1000 mg of Calcium daily. . Take 600 IU of Vitamin D daily.  Take 800 IU of Calcium daily if you are older than age 24.  . Take a general multivitamin daily.   Healthy diet: Eat a variety of foods, including fruits, vegetables, vegetable protein such as beans, lentils, tofu, and grains, such as rice.  Limit meat or animal protein, but if you eat meat, choose leans cuts such as chicken, fish, or [place].  Drink plenty of water daily.  Decrease saturated fat in the diet, avoid lots of red meat, processed foods, sweets, fast foods, and fried foods.  Limit salt and caffeine intake.  Exercise: Aerobic exercise helps maintain good heart health. Weight bearing exercise helps keep bones and muscles working strong.  We recommend at least 30-40 minutes of exercise most days of the week.   Fall prevention: Falls are the leading cause of injuries, accidents, and accidental deaths in people over the age of 7. Falling is a real threat to your ability to  live on your own.  Causes include poor eyesight or poor hearing, illness, poor lighting, throw rugs, clutter in your home, and medication side effects causing dizziness or balance problems.  Such medications can include medications for depression, sleep problems, high blood pressure, diabetes, and heart conditions.   PREVENTION  Be sure your home is as safe as possible. Here are some tips:  Wear shoes with non-skid soles (not house slippers).   Be sure your home and outside area are well lit.   Use night lights throughout your house, including hallways and stairways.   Remove clutter and clean up spills on floors and walkways.   Remove throw rugs or fasten them to the floor with carpet tape. Tack down carpet edges.   Do not place electrical cords across pathways.   Install grab bars in your bathtub, shower, and toilet area. Towel bars should not be used as a grab bar.   Install handrails on both sides of stairways.   Do not climb on stools or stepladders. Get someone else to help with jobs that require climbing.   Do not wax your floors at all, or use a non-skid wax.   Repair uneven or unsafe sidewalks, walkways or stairs.   Keep frequently used items within reach.   Be aware of pets so you do not trip.  Get regular check-ups from your doctor, and take good care of yourself:  Have your eyes checked every year for vision changes, cataracts, glaucoma, and other eye problems. Wear eyeglasses as directed.  Have your hearing checked every 2 years, or anytime you or others think that you cannot hear well. Use hearing aids as directed.   See your caregiver if you have foot pain or corns. Sore feet can contribute to falls.   Let your caregiver know if a medicine is making you feel dizzy or making you lose your balance.   Use a cane, walker, or wheelchair as directed. Use walker or wheelchair brakes when getting in and out.   When you get up from bed, sit on the side of the bed for 1  to 2 minutes before you stand up. This will give your blood pressure time to adjust, and you will feel less dizzy.   If you need to go to the bathroom often, consider using a bedside commode.  Disease prevention:  If you smoke or chew tobacco, find out from your caregiver how to quit. It can literally save your life, no matter how long you have been a tobacco user. If you do not use tobacco, never begin. Medicare does cover some smoking cessation counseling.  Maintain a healthy diet and normal weight. Increased weight leads to problems with blood pressure and diabetes. We check your height, weight, and BMI as part of your yearly visit.  The Body Mass Index or BMI is a way of measuring how much of your body is fat. Having a BMI above 27 increases the risk of heart disease, diabetes, hypertension, stroke and other problems related to obesity. Your caregiver can help determine your BMI and based on it develop an exercise and dietary program to help you achieve or maintain this important measurement at a healthful level.  High blood pressure causes heart and blood vessel problems.  Persistent high blood pressure should be treated with medicine if weight loss and exercise do not work.  We check your blood pressure as part of your yearly visit.  Avoid drinking alcohol in excess (more than two drinks per day).  Avoid use of street drugs. Do not share needles with anyone. Ask for professional help if you need assistance or instructions on stopping the use of alcohol, cigarettes, and/or drugs.  Brush your teeth twice a day with fluoride toothpaste, and floss once a day. Good oral hygiene prevents tooth decay and gum disease. The problems can be painful, unattractive, and can cause other health problems. Visit your dentist for a routine oral and dental checkup and preventive care every 6-12 months.   See your eye doctor yearly for routine screening for things like glaucoma.  Look at your skin regularly.   Use a mirror to look at your back. Notify your caregivers of changes in moles, especially if there are changes in shapes, colors, a size larger than a pencil eraser, an irregular border, or development of new moles.  Safety:  Use seatbelts 100% of the time, whether driving or as a passenger.  Use safety devices such as hearing protection if you work in environments with loud noise or significant background noise.  Use safety glasses when doing any work that could send debris in to the eyes.  Use a helmet if you ride a bike or motorcycle.  Use appropriate safety gear for contact sports.  Talk to your caregiver about gun safety.  Use sunscreen with a SPF (or skin protection factor) of 15 or greater.  Lighter skinned people are at a greater risk of skin cancer. Don't forget to also wear sunglasses in order to protect your eyes from too much  damaging sunlight. Damaging sunlight can accelerate cataract formation.   If you have multiple sexual partners, or if you are not in a monogamous relationship, practice safe sex. Use condoms. Condoms are used to help reduce the spread of sexually transmitted infections (or STIs).  Consider an HIV test if you have never been tested.  Consider routine screening for STIs if you have multiple sexual partners.   Keep carbon monoxide and smoke detectors in your home functioning at all times. Change the batteries every 6 months or use a model that plugs into the wall or is hard wired in.   END OF LIFE PLANNING/ADVANCED DIRECTIVES Advance health-care planning is deciding the kind of care you want at the end of life. While alert competent adults are able to exercise their rights to make health care and financial decisions, problems arise when an individual becomes unconscious, incapacitated, or otherwise unable to communicate or make such decisions. Advance health care directives are the legal documents in which you give written instructions about your choices limited,  aggressive or palliative care if, in the future, you cannot speak for yourself.  Advanced directives include the following: [place] allows you to appoint someone to act as your health care agent to make health care decisions for you should it be determined by your health care provider that you are no longer able to make these decisions for yourself.  A Living Will is a legal document in which you can declare that under certain conditions you desire your life not be prolonged by extraordinary or artificial means during your last illness or when you are near death. We can provide you with sample advanced directives, you can get an attorney to prepare these for you, or you can visit [place] Secretary of State's website for additional information and resources at [email or web address]  Further, I recommend you have an attorney prepare a Will and Durable Power of Attorney if you haven't done so already.  Please get [place] a copy of your health care Advanced Directives.   PREVENTATIV E CARE RECOMMENDATIONS:  Vaccinations: We recommend the following vaccinations as part of your preventative care:  Pneumococcal vaccine is recommended to protect against certain types of pneumonia.  This is normally recommended for adults age 1 or older once, or up to every 5 years for those at high risk.  The vaccine is also recommended for adults younger than 65 years old with certain underlying conditions that make them high risk for pneumonia.  Influenza vaccine is recommended to protect against seasonal influenza or "the flu." Influenza is a serious disease that can lead to hospitalization and sometimes even death. Traditional flu vaccines (called trivalent vaccines) are made to protect against three flu viruses; an influenza A (H1N1) virus, an influenza A (H3N2) virus, and an influenza B virus. In addition, there are flu vaccines made to protect against four flu viruses (called  "quadrivalent" vaccines). These vaccines protect against the same viruses as the trivalent vaccine and an additional B virus.  We recommend the high dose influenza vaccine to those 65 years and older.  Hepatitis B vaccine to protect against a form of infection of the liver by a virus acquired from blood or body fluids, particularly for high risk groups.  Td or Tdap vaccine to protect against Tetanus, diphtheria and pertussis which can be very serious.  These diseases are caused by bacteria.  Diphtheria and pertussis are spread from person to person through coughing or sneezing.  Tetanus enters  the body through cuts, scratches, or wounds.  Tetanus (Lockjaw) causes painful muscle tightening and stiffness, usually all over the body.  Diphtheria can cause a thick coating to form in the back of the throat.  It can lead to breathing problems, paralysis, heart failure, and death.  Pertussis (Whooping Cough) causes severe coughing spells, which can cause difficulty breathing, vomiting and disturbed sleep.  Td or Tdap is usually given every 10 years.  Shingles vaccine to protect against Varicella Zoster if you are older than age 60, or younger than 65 years old with certain underlying illness.    Cancer Screening: Most routine colon cancer screening begins at the age of 18.  Subsequent colonoscopies are performed either every 5-10 years for normal screening, or every 2-5 years for higher risks patients, up until age 55 years of age. Annual screening is done with easy to use take-home tests to check for hidden blood in the stool called hemoccult tests.  Sigmoidoscopy or colonoscopy can detect the earliest forms of colon cancer and is life saving. These tests use a small camera at the end of a tube to directly examine the colon.   Pelvic Exam and Pap Smear: Pelvic exams and pap smears are performed routinely to evaluate for abnormalities as well as cancers including cervical and vaginal cancers.  This is generally  performed every 2-3 years for most women, or more frequently for higher risk patients.  Mammograms: Mammograms are used to screen for breast cancer.  Medicare covers baseline screening once from ages 86-35 years old, but will cover mammograms yearly for those 40 years and older.  In accordance with other guidelines, you may not need a mammogram every year though.  The decision on how frequently you need a mammogram should be discussed with you medical provider.    Osteoporosis Screening: Screening for osteoporosis usually begins at age 9 for women, and can be done as frequent as every 2 years.  However, women or men with higher risk of osteoporosis may be screened earlier than age 53.  Osteoporosis or low bone mass is diminished bone strength from alterations in bone architecture leading to bone fragility and increased fracture risk.     Cardiovascular Screening: Fat and cholesterol leaves deposits in your arteries that can block them. This causes heart disease and vessel disease elsewhere in your body.  If your cholesterol is found to be high, or if you have heart disease or certain other medical conditions, then you may need to have your cholesterol monitored frequently and be treated with medication. Cardiovascular screening in the form of lab tests for cholesterol, HDL and triglycerides can be done every 5 years.  A screening electrocardiogram can be done as part of the Welcome to Medicare physical.  Diabetes Screening: Diabetes screening can be done at least every 3 years for those with risk factors,  or every 6-30months for prediabetic patients.  Screening includes fasting blood sugar test or glucose tolerance test.  Risk factors include hypertension, dyslipidemia, obesity, previously abnormal glucose tests, family history of diabetes, age 32 years or older, and history of gestations diabetes.   AAA (abdominal aortic aneurysm) Screening: Medicare allows for a one time ultrasound to screen for  abdominal aortic aneurysm if done as a referral as part of the Welcome to Medicare exam.  Men eligible for this screening include those men between age 58-32 years of age who have smoked at least 100 cigarettes in his lifetime and/or has a family history of AAA.  HIV  Screening:  Medicare allows for yearly screening for patients at high risk for contracting HIV disease.

## 2016-11-16 LAB — COMPREHENSIVE METABOLIC PANEL
AG RATIO: 1.2 (calc) (ref 1.0–2.5)
ALBUMIN MSPROF: 4.4 g/dL (ref 3.6–5.1)
ALKALINE PHOSPHATASE (APISO): 97 U/L (ref 33–130)
ALT: 37 U/L — ABNORMAL HIGH (ref 6–29)
AST: 28 U/L (ref 10–35)
BUN: 12 mg/dL (ref 7–25)
CHLORIDE: 96 mmol/L — AB (ref 98–110)
CO2: 28 mmol/L (ref 20–32)
CREATININE: 0.79 mg/dL (ref 0.50–0.99)
Calcium: 9.8 mg/dL (ref 8.6–10.4)
GLOBULIN: 3.6 g/dL (ref 1.9–3.7)
Glucose, Bld: 106 mg/dL — ABNORMAL HIGH (ref 65–99)
POTASSIUM: 4.1 mmol/L (ref 3.5–5.3)
Sodium: 136 mmol/L (ref 135–146)
Total Bilirubin: 0.5 mg/dL (ref 0.2–1.2)
Total Protein: 8 g/dL (ref 6.1–8.1)

## 2016-11-16 LAB — LIPID PANEL
CHOL/HDL RATIO: 3.8 (calc) (ref <5.0)
CHOLESTEROL: 211 mg/dL — AB (ref <200)
HDL: 55 mg/dL (ref >50)
LDL CHOLESTEROL (CALC): 129 mg/dL — AB
Non-HDL Cholesterol (Calc): 156 mg/dL (calc) — ABNORMAL HIGH (ref <130)
Triglycerides: 153 mg/dL — ABNORMAL HIGH (ref <150)

## 2016-11-16 LAB — IRON,TIBC AND FERRITIN PANEL
%SAT: 38 % (calc) (ref 11–50)
FERRITIN: 734 ng/mL — AB (ref 20–288)
IRON: 122 ug/dL (ref 45–160)
TIBC: 318 mcg/dL (calc) (ref 250–450)

## 2016-11-16 LAB — CBC WITH DIFFERENTIAL/PLATELET
BASOS ABS: 95 {cells}/uL (ref 0–200)
BASOS PCT: 0.9 %
EOS PCT: 2.1 %
Eosinophils Absolute: 223 cells/uL (ref 15–500)
HEMATOCRIT: 46.3 % — AB (ref 35.0–45.0)
HEMOGLOBIN: 16.3 g/dL — AB (ref 11.7–15.5)
Lymphs Abs: 2947 cells/uL (ref 850–3900)
MCH: 31.2 pg (ref 27.0–33.0)
MCHC: 35.2 g/dL (ref 32.0–36.0)
MCV: 88.5 fL (ref 80.0–100.0)
MPV: 10.3 fL (ref 7.5–12.5)
Monocytes Relative: 6.8 %
Neutro Abs: 6614 cells/uL (ref 1500–7800)
Neutrophils Relative %: 62.4 %
Platelets: 335 10*3/uL (ref 140–400)
RBC: 5.23 10*6/uL — ABNORMAL HIGH (ref 3.80–5.10)
RDW: 11.9 % (ref 11.0–15.0)
Total Lymphocyte: 27.8 %
WBC: 10.6 10*3/uL (ref 3.8–10.8)
WBCMIX: 721 {cells}/uL (ref 200–950)

## 2016-11-16 LAB — URINALYSIS, MICROSCOPIC ONLY
Bacteria, UA: NONE SEEN /HPF
Hyaline Cast: NONE SEEN /LPF
RBC / HPF: NONE SEEN /HPF (ref 0–2)
SQUAMOUS EPITHELIAL / LPF: NONE SEEN /HPF (ref <=5)
WBC, UA: NONE SEEN /HPF (ref 0–5)

## 2016-11-16 LAB — HEMOGLOBIN A1C
EAG (MMOL/L): 6.6 (calc)
HEMOGLOBIN A1C: 5.8 %{Hb} — AB (ref <5.7)
MEAN PLASMA GLUCOSE: 120 (calc)

## 2016-11-16 LAB — TEST AUTHORIZATION

## 2016-11-16 LAB — VITAMIN D 25 HYDROXY (VIT D DEFICIENCY, FRACTURES): Vit D, 25-Hydroxy: 27 ng/mL — ABNORMAL LOW (ref 30–100)

## 2016-11-16 LAB — TSH: TSH: 0.95 mIU/L (ref 0.40–4.50)

## 2016-11-17 ENCOUNTER — Other Ambulatory Visit: Payer: Self-pay | Admitting: Family Medicine

## 2016-11-17 DIAGNOSIS — D751 Secondary polycythemia: Secondary | ICD-10-CM

## 2016-11-18 ENCOUNTER — Other Ambulatory Visit: Payer: Self-pay

## 2016-11-18 ENCOUNTER — Ambulatory Visit (HOSPITAL_COMMUNITY): Payer: Medicare Other | Attending: Internal Medicine

## 2016-11-18 DIAGNOSIS — R9431 Abnormal electrocardiogram [ECG] [EKG]: Secondary | ICD-10-CM | POA: Insufficient documentation

## 2016-11-18 DIAGNOSIS — I10 Essential (primary) hypertension: Secondary | ICD-10-CM | POA: Insufficient documentation

## 2016-11-23 ENCOUNTER — Telehealth: Payer: Self-pay | Admitting: Family Medicine

## 2016-11-23 NOTE — Telephone Encounter (Signed)
Received requested colonoscopy from [facility]. Sending back for review.

## 2016-11-24 ENCOUNTER — Telehealth: Payer: Self-pay | Admitting: Oncology

## 2016-11-24 ENCOUNTER — Encounter: Payer: Medicare Other | Admitting: Oncology

## 2016-11-24 ENCOUNTER — Other Ambulatory Visit: Payer: Self-pay | Admitting: Family Medicine

## 2016-11-24 ENCOUNTER — Ambulatory Visit (HOSPITAL_BASED_OUTPATIENT_CLINIC_OR_DEPARTMENT_OTHER): Payer: Medicare Other | Admitting: Oncology

## 2016-11-24 VITALS — BP 154/95 | HR 73 | Temp 98.1°F | Resp 17 | Ht 61.0 in | Wt 159.2 lb

## 2016-11-24 DIAGNOSIS — D751 Secondary polycythemia: Secondary | ICD-10-CM | POA: Diagnosis not present

## 2016-11-24 NOTE — Progress Notes (Signed)
Reason for Referral: Polycythemia.  HPI: 66 year old woman native of [place] but currently lives in this area and have done so for the last 30 years.  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  She has also history of nephrectomy related to nephrolithiasis without any malignancy.  Her most recent imaging studies in October 2017 had an MRI which showed a 10 mm hypervascular region corresponding to a known focal nodular hyperplasia that has decreased.  No other malignancy has been detected.  She was noted to have elevated hemoglobin in January 2018.  At that time her hemoglobin was 16 and in November 2018 it was 16.3.  Upper limit of normal was 15.5.  She had normal white cell count and platelet count.  She is completely asymptomatic from these findings.  She denies any headaches or thrombosis history.  She denies any smoking or pulmonary disease.  She does report seasonal allergies.  She denies any toxic fume exposure or travel to higher elevations.  She is on antihypertensive medication with diuretics.  She reports he drinks adequate amount of water daily.  She does not report any headaches, blurred vision, syncope or seizures.  She does not report any fevers or chills or sweats.  She does not report any cough, wheezing or hemoptysis.  She does not report chest pain, palpitation, orthopnea or leg edema.  She does not report any nausea, vomiting or abdominal pain.  She does not report any frequency urgency or hesitancy.  She does not report any skeletal complaints.  Remaining review of systems unremarkable.  Past Medical History:  Diagnosis Date  . Anxiety    only takes when travels  . Depression   . Elevated LFTs   . GERD (gastroesophageal reflux disease)   . Hepatic steatosis   . Hiatal hernia   . Hypertension   . Liver lesion   . Osteopenia   . Prediabetes   . Vitamin D deficiency   :  Past Surgical History:  Procedure  Laterality Date  . NEPHRECTOMY Right 66 years old  :   Current Outpatient Medications:  .  amoxicillin (AMOXIL) 875 MG tablet, Take 1 tablet (875 mg total) by mouth 2 (two) times daily., Disp: 20 tablet, Rfl: 0 .  fluticasone (FLONASE) 50 MCG/ACT nasal spray, Place 2 sprays into both nostrils daily., Disp: , Rfl:  .  Naphazoline-Pheniramine (EYE ALLERGY RELIEF OP), Apply to eye., Disp: , Rfl:  .  omeprazole (PRILOSEC) 20 MG capsule, TAKE 1 CAPSULE(20 MG) BY MOUTH DAILY, Disp: 30 capsule, Rfl: 5 .  triamterene-hydrochlorothiazide (MAXZIDE-25) 37.5-25 MG tablet, TAKE 1 TABLET BY MOUTH DAILY, Disp: 30 tablet, Rfl: 2:  Allergies  Allergen Reactions  . Prednisone Other (See Comments)    Makes her mean and not herself  :  Family History  Problem Relation Age of Onset  . Aneurysm Father        died at 34  . Hypertension Father   . Arthritis Father   . Hyperlipidemia Father   . Alcohol abuse Father   :  Social History   Socioeconomic History  . Marital status: Married    Spouse name: Not on file  . Number of children: Not on file  . Years of education: Not on file  . Highest education level: Not on file  Social Needs  . Financial resource strain: Not on file  . Food insecurity - worry: Not on file  . Food insecurity -  inability: Not on file  . Transportation needs - medical: Not on file  . Transportation needs - non-medical: Not on file  Occupational History  . Not on file  Tobacco Use  . Smoking status: Never Smoker  . Smokeless tobacco: Never Used  Substance and Sexual Activity  . Alcohol use: Yes    Alcohol/week: 0.0 oz    Comment: glass a wine every day  . Drug use: No  . Sexual activity: Yes  Other Topics Concern  . Not on file  Social History Narrative  . Not on file  :  Pertinent items are noted in HPI.  Exam: Blood pressure (!) 154/95, pulse 73, temperature 98.1 F (36.7 C), temperature source Oral, resp. rate 17, height 5\' 1"  (1.549 m), weight 159 lb  3.2 oz (72.2 kg), SpO2 98 %.  ECOG 0 General appearance: alert and cooperative.  Without distress. Throat: No oral thrush or ulcers. Neck: no adenopathy Back: negative Resp: clear to auscultation bilaterally Chest wall: no tenderness Cardio: regular rate and rhythm, S1, S2 normal, no murmur, click, rub or gallop GI: soft, non-tender; bowel sounds normal; no masses,  no organomegaly Extremities: extremities normal, atraumatic, no cyanosis or edema  CBC    Component Value Date/Time   WBC 10.6 11/15/2016 0950   RBC 5.23 (H) 11/15/2016 0950   HGB 16.3 (H) 11/15/2016 0950   HGB 14.4 01/19/2006 1523   HCT 46.3 (H) 11/15/2016 0950   HCT 42.1 01/19/2006 1523   PLT 335 11/15/2016 0950   PLT 303 01/19/2006 1523   MCV 88.5 11/15/2016 0950   MCV 90.9 01/19/2006 1523   MCH 31.2 11/15/2016 0950   MCHC 35.2 11/15/2016 0950   RDW 11.9 11/15/2016 0950   RDW 12.9 01/19/2006 1523   LYMPHSABS 2,947 11/15/2016 0950   LYMPHSABS 3.1 01/19/2006 1523   MONOABS 720 02/04/2016 1045   MONOABS 0.6 01/19/2006 1523   EOSABS 223 11/15/2016 0950   EOSABS 0.3 01/19/2006 1523   BASOSABS 95 11/15/2016 0950   BASOSABS 0.0 01/19/2006 1523     Chemistry      Component Value Date/Time   NA 136 11/15/2016 0950   K 4.1 11/15/2016 0950   CL 96 (L) 11/15/2016 0950   CO2 28 11/15/2016 0950   BUN 12 11/15/2016 0950   CREATININE 0.79 11/15/2016 0950      Component Value Date/Time   CALCIUM 9.8 11/15/2016 0950   ALKPHOS 79 02/04/2016 1045   AST 28 11/15/2016 0950   ALT 37 (H) 11/15/2016 0950   BILITOT 0.5 11/15/2016 0950       Assessment and Plan:   66 year old woman with the following issues:  1. Polycythemia noted in January 2018.  Her hemoglobin was 16.0 and a hematocrit of 47.5.  Repeat CBC in November 2018 she had a hemoglobin of 16.3 with normal indices and white cell count.  Her CBC in October 2017 showed a hemoglobin of 15.5.  The differential diagnosis was discussed today.  Secondary  polycythemia is the most likely etiology although there is no clear factors causing it.  She denies any smoking or toxic fume exposure.  She has no documented history of sleep apnea or hormone supplement.  Focal nodular hyperplasia of the liver can cause elevated erythropoietin and could contribute to her polycythemia.  Polycythemia vera considered less likely at this point but always a possibility.  From a management standpoint, I have recommended continued observation for the time being and repeat laboratory testing in 4 months.  We  will obtain a JAK2 testing to evaluate her for possible myeloproliferative disorder.  I see no need for any phlebotomy at this time.  2.  Thrombosis prophylaxis: I have recommended low-dose aspirin at 81 mg daily.  3.  Follow-up: Will be in 4 months to follow her progress.

## 2016-11-24 NOTE — Telephone Encounter (Signed)
Gave patient AVS and calendar of upcoming march appointments.

## 2016-11-25 ENCOUNTER — Encounter: Payer: Self-pay | Admitting: Family Medicine

## 2016-11-28 ENCOUNTER — Encounter: Payer: Self-pay | Admitting: Family Medicine

## 2016-12-06 ENCOUNTER — Other Ambulatory Visit: Payer: Self-pay | Admitting: Family Medicine

## 2016-12-07 ENCOUNTER — Ambulatory Visit
Admission: RE | Admit: 2016-12-07 | Discharge: 2016-12-07 | Disposition: A | Discharge status: Home / Self Care | Payer: Medicare Other | Source: Ambulatory Visit | Attending: Family Medicine | Admitting: Family Medicine

## 2016-12-07 DIAGNOSIS — Z1231 Encounter for screening mammogram for malignant neoplasm of breast: Secondary | ICD-10-CM

## 2017-01-04 ENCOUNTER — Other Ambulatory Visit: Payer: Self-pay | Admitting: Family Medicine

## 2017-02-05 ENCOUNTER — Other Ambulatory Visit: Payer: Self-pay | Admitting: Family Medicine

## 2017-03-10 ENCOUNTER — Other Ambulatory Visit: Payer: Medicare Other

## 2017-03-14 ENCOUNTER — Telehealth: Payer: Self-pay | Admitting: Oncology

## 2017-03-14 NOTE — Telephone Encounter (Signed)
Patient called to reschedule she is out of town °

## 2017-03-15 ENCOUNTER — Other Ambulatory Visit: Payer: Medicare Other

## 2017-03-17 ENCOUNTER — Inpatient Hospital Stay: Payer: Medicare Other | Attending: Oncology

## 2017-03-17 DIAGNOSIS — D751 Secondary polycythemia: Secondary | ICD-10-CM | POA: Diagnosis present

## 2017-03-17 LAB — CBC WITH DIFFERENTIAL/PLATELET
Basophils Absolute: 0.1 10*3/uL (ref 0.0–0.1)
Basophils Relative: 1 %
EOS ABS: 0.2 10*3/uL (ref 0.0–0.5)
EOS PCT: 3 %
HCT: 46.9 % — ABNORMAL HIGH (ref 34.8–46.6)
Hemoglobin: 15.8 g/dL (ref 11.6–15.9)
Lymphocytes Relative: 31 %
Lymphs Abs: 2.5 10*3/uL (ref 0.9–3.3)
MCH: 30.9 pg (ref 25.1–34.0)
MCHC: 33.6 g/dL (ref 31.5–36.0)
MCV: 91.8 fL (ref 79.5–101.0)
MONO ABS: 0.6 10*3/uL (ref 0.1–0.9)
MONOS PCT: 7 %
Neutro Abs: 4.7 10*3/uL (ref 1.5–6.5)
Neutrophils Relative %: 58 %
PLATELETS: 257 10*3/uL (ref 145–400)
RBC: 5.11 MIL/uL (ref 3.70–5.45)
RDW: 13.1 % (ref 11.2–14.5)
WBC: 8 10*3/uL (ref 3.9–10.3)

## 2017-03-24 ENCOUNTER — Inpatient Hospital Stay (HOSPITAL_BASED_OUTPATIENT_CLINIC_OR_DEPARTMENT_OTHER): Payer: Medicare Other | Admitting: Oncology

## 2017-03-24 VITALS — BP 142/84 | HR 82 | Temp 97.6°F | Resp 18 | Ht 61.0 in | Wt 160.7 lb

## 2017-03-24 DIAGNOSIS — D751 Secondary polycythemia: Secondary | ICD-10-CM

## 2017-03-24 NOTE — Progress Notes (Signed)
Hematology and Oncology Follow Up Visit  Alexandra Davies [id number] 10/25/1950 67 y.o. 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 Her workup did not reveal any polycythemia vera or myeloproliferative disorder.  Current therapy: active surveillance.  Interim History: Alexandra Davies presents today for a follow-up visit. She reports no major changes in her health since the last visit. She is drinking water more and staying hydrated. She denied any thrombosis or bleeding episodes. She denied any headaches or neurological deficits. She continues to attend activities of daily living without any decline.  She does not report any headaches, blurry vision, syncope or seizures. Does not report any fevers, chills or sweats.  Does not report any cough, wheezing or hemoptysis.  Does not report any chest pain, palpitation, orthopnea or leg edema.  Does not report any nausea, vomiting or abdominal pain.  Does not report any constipation or diarrhea.  Does not report any skeletal complaints.    Does not report frequency, urgency or hematuria.  Does not report any skin rashes or lesions. Does not report any heat or cold intolerance.  Does not report any lymphadenopathy or petechiae.  Does not report any anxiety or depression.  Remaining review of systems is negative.    Medications: I have reviewed the patient's current medications.  Current Outpatient Medications  Medication Sig Dispense Refill  . amoxicillin (AMOXIL) 875 MG tablet Take 1 tablet (875 mg total) by mouth 2 (two) times daily. 20 tablet 0  . fluticasone (FLONASE) 50 MCG/ACT nasal spray Place 2 sprays into both nostrils daily.    Mable Fill (EYE ALLERGY RELIEF OP) Apply to eye.    Marland Kitchen omeprazole (PRILOSEC) 20 MG capsule TAKE 1 CAPSULE(20 MG) BY MOUTH DAILY 30 capsule 5  . triamterene-hydrochlorothiazide (MAXZIDE-25) 37.5-25 MG  tablet TAKE 1 TABLET BY MOUTH DAILY 30 tablet 4   No current facility-administered medications for this visit.      Allergies:  Allergies  Allergen Reactions  . Prednisone Other (See Comments)    Makes her mean and not herself    Past Medical History, Surgical history, Social history, and Family History were reviewed and updated.    Physical Exam: Blood pressure (!) 142/84, pulse 82, temperature 97.6 F (36.4 C), temperature source Oral, resp. rate 18, height 5\' 1"  (1.549 m), weight 160 lb 11.2 oz (72.9 kg), SpO2 100 %. ECOG:  General appearance: alert and cooperative appeared without distress. Head: Normocephalic, without obvious abnormality Oropharynx: No oral thrush or ulcers. Eyes: No scleral icterus.  Pupils are equal and round reactive to light. Lymph nodes: Cervical, supraclavicular, and axillary nodes normal. Heart:regular rate and rhythm, S1, S2 normal, no murmur, click, rub or gallop Lung:chest clear, no wheezing, rales, normal symmetric air entry Abdomin: soft, non-tender, without masses or organomegaly. Neurological: No motor, sensory deficits.  Intact deep tendon reflexes. Skin: No rashes or lesions.  No ecchymosis or petechiae. Musculoskeletal: No joint deformity or effusion. Psychiatric: Mood and affect are appropriate.    Lab Results: Lab Results  Component Value Date   WBC 8.0 03/17/2017   HGB 15.8 03/17/2017   HCT 46.9 (H) 03/17/2017   MCV 91.8 03/17/2017   PLT 257 03/17/2017     Chemistry      Component Value Date/Time   NA 136 11/15/2016 0950   K 4.1 11/15/2016 0950   CL 96 (Davies) 11/15/2016 0950   CO2 28 11/15/2016 0950   BUN  12 11/15/2016 0950   CREATININE 0.79 11/15/2016 0950      Component Value Date/Time   CALCIUM 9.8 11/15/2016 0950   ALKPHOS 79 02/04/2016 1045   AST 28 11/15/2016 0950   ALT 37 (H) 11/15/2016 0950   BILITOT 0.5 11/15/2016 0950       Impression and Plan:  67 year old woman with the following issues:  1.  Secondary polycythemia noted in January 2018: the differential diagnosis was discussed today in detail which include polycythemia vera which is considered less likely. Reactive changes related to diuretics and medication could also be consideration.  Her hemoglobin today is back within normal range and I see no indications to suggest a hematological disorder. I have recommended continuing observation and surveillance and no further hematological workup is needed at this time.   2.  Thrombosis prophylaxis: Her risk of thrombosis is very low but recommended low-dose aspirin at this time.  3.  Follow-up: Will be as needed in the future..  15  minutes was spent with the patient face-to-face today.  More than 50% of time was dedicated to patient counseling, education and answering questions regarding diagnosis and future plan of care.    Alexandra Button, MD 3/15/201910:44 AM

## 2017-03-27 ENCOUNTER — Telehealth: Payer: Self-pay

## 2017-03-27 NOTE — Telephone Encounter (Signed)
Per 3/15 no los 

## 2017-03-28 ENCOUNTER — Telehealth: Payer: Self-pay

## 2017-03-28 NOTE — Telephone Encounter (Signed)
Per 3/15 no los 

## 2017-04-07 LAB — JAK2 (INCLUDING V617F AND EXON 12), MPL,& CALR-NEXT GEN SEQ

## 2017-05-09 ENCOUNTER — Encounter: Payer: Self-pay | Admitting: Family Medicine

## 2017-05-09 ENCOUNTER — Ambulatory Visit: Payer: Medicare Other | Admitting: Family Medicine

## 2017-05-09 VITALS — BP 118/80 | HR 81 | Temp 98.1°F | Ht 61.0 in | Wt 158.6 lb

## 2017-05-09 DIAGNOSIS — J301 Allergic rhinitis due to pollen: Secondary | ICD-10-CM | POA: Insufficient documentation

## 2017-05-09 MED ORDER — MONTELUKAST SODIUM 10 MG PO TABS: 10.0000 mg | ORAL_TABLET | Freq: Every day | ORAL | 3 refills | Status: DC

## 2017-05-09 NOTE — Progress Notes (Signed)
   Subjective:    Patient ID: Alexandra Davies, female    DOB: 1950-08-29, 67 y.o.   MRN: [id number]  HPI She complains of difficulty of cough, sneezing, PND, itchy watery eyes, drainage from the left ear several days ago.  Presently she is taking Flonase, Mucinex, using eyedrops as well as Claritin.   Review of Systems     Objective:   Physical Exam Alert and in no distress. Tympanic membranes and canals are normal. Pharyngeal area is normal. Neck is supple without adenopathy or thyromegaly. Cardiac exam shows a regular sinus rhythm without murmurs or gallops. Lungs are clear to auscultation.        Assessment & Plan:  Seasonal allergic rhinitis due to pollen - Plan: montelukast (SINGULAIR) 10 MG tablet Recommend she continue on this.  Also recommend Afrin nasal spray when she flies.  We will also add Singulair to her regimen.

## 2017-05-09 NOTE — Patient Instructions (Addendum)
Continue on your nasal spray and the Claritin and ongoing to add a medicine called Singulair If you have problems with your sinuses use Afrin nasal spray before to get on the plane

## 2017-05-12 ENCOUNTER — Ambulatory Visit: Payer: Medicare Other | Admitting: Family Medicine

## 2017-05-12 ENCOUNTER — Encounter: Payer: Self-pay | Admitting: Family Medicine

## 2017-05-12 ENCOUNTER — Telehealth: Payer: Self-pay

## 2017-05-12 VITALS — BP 128/82 | HR 82 | Temp 98.2°F | Resp 16 | Ht 61.0 in | Wt 155.4 lb

## 2017-05-12 DIAGNOSIS — R059 Cough, unspecified: Secondary | ICD-10-CM

## 2017-05-12 DIAGNOSIS — J309 Allergic rhinitis, unspecified: Secondary | ICD-10-CM

## 2017-05-12 DIAGNOSIS — R05 Cough: Secondary | ICD-10-CM

## 2017-05-12 MED ORDER — AMOXICILLIN-POT CLAVULANATE 875-125 MG PO TABS: 1.0000 | ORAL_TABLET | Freq: Two times a day (BID) | ORAL | 0 refills | Status: DC

## 2017-05-12 NOTE — Patient Instructions (Signed)
Continue treating your allergies with an antihistamine such as Xyzal, nasal spray Flonase, and Singulair.   Take the antibiotic with you on your cruise in case your symptoms get worse. Do not take it if you do not need it.

## 2017-05-12 NOTE — Progress Notes (Signed)
Chief Complaint  Patient presents with  . other    cold issuse have not improved since earlier in the week. Pt has had fever anf yellow mucus, tired and itching eyes    Subjective:  Alexandra Davies is a 67 y.o. female who presents for a one week history of nasal congestion, rhinorrhea, sneezing, hoarse voice and mild cough. Reports a low grade fever for the past 2 days as high as 99.4.  She has underlying seasonal allergies and was started on Singulair 2 days ago. She is not worsening or improving. States she is leaving in 2 days on a cruise and is worried that she might need an antibiotic.    Denies fever, chills, headache, sinus pressure, sore throat, chest pain, shortness of breath, wheezing, abdominal pain, N/V/D.   Treatment to date: antihistamines and Singulair and Flonase.  Positive sick contacts.  No other aggravating or relieving factors.  No other c/o.  ROS as in subjective.   Objective: Vitals:   05/12/17 1543  BP: 128/82  Pulse: 82  Resp: 16  Temp: 98.2 F (36.8 C)  SpO2: 98%    General appearance: Alert, WD/WN, no distress, mildly ill appearing                             Skin: warm, no rash                           Head: no sinus tenderness                            Eyes: conjunctiva normal, corneas clear, PERRLA                            Ears: pearly TMs, external ear canals normal                          Nose: septum midline, turbinates swollen, with erythema and clear discharge             Mouth/throat: MMM, tongue normal, mild pharyngeal erythema                           Neck: supple, no adenopathy, no thyromegaly, nontender                          Heart: RRR, normal S1, S2, no murmurs                         Lungs: CTA bilaterally, no wheezes, rales, or rhonchi      Assessment: Allergic rhinitis, unspecified seasonality, unspecified trigger  Cough   Plan: Discussed diagnosis and treatment of URI and seasonal allergies. Recommend switching from  Claritin to Xyzal and continue on Flonase and Singulair. Will prescribe her Augmentin to take on her cruise in case her symptoms worsen.  Suggested symptomatic OTC remedies. Nasal saline spray for congestion.  Tylenol or Ibuprofen OTC for fever and malaise.  Call/return as needed.

## 2017-05-12 NOTE — Telephone Encounter (Signed)
Patient called this am and is still running fever and has cong.   Dr Redmond School wanted her to call back if she was still having issues.

## 2017-05-12 NOTE — Telephone Encounter (Signed)
Pt coming in later. [place] 05-12-17

## 2017-05-16 ENCOUNTER — Ambulatory Visit: Payer: Medicare Other | Admitting: Family Medicine

## 2017-05-17 ENCOUNTER — Ambulatory Visit: Payer: Medicare Other | Admitting: Family Medicine

## 2017-05-18 ENCOUNTER — Ambulatory Visit: Payer: Medicare Other | Admitting: Family Medicine

## 2017-05-24 ENCOUNTER — Other Ambulatory Visit: Payer: Self-pay | Admitting: Family Medicine

## 2017-05-25 ENCOUNTER — Ambulatory Visit: Payer: Medicare Other | Admitting: Family Medicine

## 2017-05-25 ENCOUNTER — Encounter: Payer: Self-pay | Admitting: Family Medicine

## 2017-05-25 VITALS — BP 120/80 | HR 63 | Ht 61.25 in | Wt 157.2 lb

## 2017-05-25 DIAGNOSIS — R945 Abnormal results of liver function studies: Secondary | ICD-10-CM

## 2017-05-25 DIAGNOSIS — R7989 Other specified abnormal findings of blood chemistry: Secondary | ICD-10-CM

## 2017-05-25 DIAGNOSIS — R05 Cough: Secondary | ICD-10-CM

## 2017-05-25 DIAGNOSIS — K760 Fatty (change of) liver, not elsewhere classified: Secondary | ICD-10-CM | POA: Diagnosis not present

## 2017-05-25 DIAGNOSIS — J301 Allergic rhinitis due to pollen: Secondary | ICD-10-CM | POA: Diagnosis not present

## 2017-05-25 DIAGNOSIS — H6121 Impacted cerumen, right ear: Secondary | ICD-10-CM | POA: Diagnosis not present

## 2017-05-25 DIAGNOSIS — D134 Benign neoplasm of liver: Secondary | ICD-10-CM | POA: Diagnosis not present

## 2017-05-25 DIAGNOSIS — I10 Essential (primary) hypertension: Secondary | ICD-10-CM | POA: Diagnosis not present

## 2017-05-25 DIAGNOSIS — E782 Mixed hyperlipidemia: Secondary | ICD-10-CM

## 2017-05-25 DIAGNOSIS — R7303 Prediabetes: Secondary | ICD-10-CM

## 2017-05-25 DIAGNOSIS — R053 Chronic cough: Secondary | ICD-10-CM

## 2017-05-25 NOTE — Patient Instructions (Addendum)
Try Sudafed along with the Xyzal and Flonase. If your symptoms are not improving or if they get worse, including dry cough, then let me know.    We will call you with your lab results.

## 2017-05-25 NOTE — Progress Notes (Signed)
Subjective:    Patient ID: Alexandra Davies, female    DOB: [birth year], 67 y.o.   MRN: [id number]  HPI Chief Complaint  Patient presents with  . 6 month follow-up    6 month follow-up   She is here for a 6 month follow up on chronic health conditions.  Reports having  persistent clear nasal drainage, intermittent nasal congestion, watery eyes, itchy and clogged right ear, post nasal drainage and dry cough. States allergies are most bothersome in the spring and fall.   Denies fever, chills, headache, dizziness, nasal congestion, sore throat, chest pain, palpitations, shortness of breath, abdominal pain, back pain, nausea, vomiting, diarrhea, urinary symptoms, lower extremity edema.  Recently seen here and was given a prescription for an antibiotic to take with her on her cruise in case she developed signs symptoms of sinus infection.  She states she did not need to take it.  States she does not feel like she has a sinus infection.  Reports using Flonase and Xyzal periodically for her symptoms.     History of secondary polycythemia.  She has been worked up by hematology.  Hypertension is well controlled on current medications.  Hyperlipidemia and is not on a statin by patient's preference.  She is fasting today  She does have a history of benign liver mass that is being monitored.  She also has severe fatty liver disease.  States she is aware of this.  History of prediabetes.   Other providers: Dr. Dickey Gave Dr. Paulita Fujita. [facility]   Reviewed allergies, medications, past medical, surgical, family, and social history.    Review of Systems Pertinent positives and negatives in the history of present illness.     Objective:   Physical Exam BP 120/80   Pulse 63   Ht 5' 1.25" (1.556 m)   Wt 157 lb 3.2 oz (71.3 kg)   BMI 29.46 kg/m   Alert and in no distress. Right external canal with a cerumen impaction and decreased hearing prior to lavage. Tympanic membranes and  canals are normal post ear lavage. Pharyngeal area is normal. Neck is supple without adenopathy or thyromegaly. Cardiac exam shows a regular sinus rhythm without murmurs or gallops. Lungs are clear to auscultation.       Assessment & Plan:  Essential hypertension - Plan: Comprehensive metabolic panel, CBC with Differential/Platelet  Seasonal allergic rhinitis due to pollen  Liver tumor (benign)  Hepatic steatosis - Plan: Comprehensive metabolic panel  Prediabetes - Plan: Hemoglobin A1c  Mixed hyperlipidemia - Plan: Lipid panel  Elevated LFTs - Plan: Comprehensive metabolic panel  Persistent dry cough  Right ear impacted cerumen  She appears to be doing well and reports good compliance with medications and no side effects.  HTN- BP well controlled. Continue on medications.  Allergies- continue on Flonase and Xyzal. May add Sudafed temporarily for congestion. She will let me know if this no longer feels like allergies.  Prediabetes - recheck Hgb A1c. Discussed healthy diet and exercise.  Hyperlipidemia- check fasting lipids. She declines statin.  Benign liver mass- last MRI in 2017. Discuss recheck at her next 6 month AWV.  Elevated LFTs and severe fatty liver- discussed this puts her at an increased risk of liver cancer and cirrhosis and needs to be closely monitored.  Follow up pending labs    Right ear lavage performed by Sabrina, CMA, due to cerumen impaction.  She tolerated this well.  Patient reported improved hearing and comfort following lavage.  Normal right  TM post lavage.

## 2017-05-26 LAB — CBC WITH DIFFERENTIAL/PLATELET
Basophils Absolute: 0 10*3/uL (ref 0.0–0.2)
Basos: 0 %
EOS (ABSOLUTE): 0.3 10*3/uL (ref 0.0–0.4)
EOS: 3 %
HEMATOCRIT: 46.5 % (ref 34.0–46.6)
Hemoglobin: 15.7 g/dL (ref 11.1–15.9)
IMMATURE GRANS (ABS): 0 10*3/uL (ref 0.0–0.1)
IMMATURE GRANULOCYTES: 0 %
LYMPHS: 24 %
Lymphocytes Absolute: 2.5 10*3/uL (ref 0.7–3.1)
MCH: 31.5 pg (ref 26.6–33.0)
MCHC: 33.8 g/dL (ref 31.5–35.7)
MCV: 93 fL (ref 79–97)
MONOS ABS: 0.6 10*3/uL (ref 0.1–0.9)
Monocytes: 6 %
NEUTROS PCT: 67 %
Neutrophils Absolute: 7 10*3/uL (ref 1.4–7.0)
PLATELETS: 316 10*3/uL (ref 150–379)
RBC: 4.98 x10E6/uL (ref 3.77–5.28)
RDW: 13.1 % (ref 12.3–15.4)
WBC: 10.4 10*3/uL (ref 3.4–10.8)

## 2017-05-26 LAB — LIPID PANEL
Chol/HDL Ratio: 3.8 ratio (ref 0.0–4.4)
Cholesterol, Total: 222 mg/dL — ABNORMAL HIGH (ref 100–199)
HDL: 59 mg/dL (ref >39)
LDL Calculated: 139 mg/dL — ABNORMAL HIGH (ref 0–99)
TRIGLYCERIDES: 118 mg/dL (ref 0–149)
VLDL Cholesterol Cal: 24 mg/dL (ref 5–40)

## 2017-05-26 LAB — COMPREHENSIVE METABOLIC PANEL
ALK PHOS: 101 IU/L (ref 39–117)
ALT: 32 IU/L (ref 0–32)
AST: 32 IU/L (ref 0–40)
Albumin/Globulin Ratio: 1.4 (ref 1.2–2.2)
Albumin: 4.7 g/dL (ref 3.6–4.8)
BILIRUBIN TOTAL: 0.5 mg/dL (ref 0.0–1.2)
BUN/Creatinine Ratio: 13 (ref 12–28)
BUN: 9 mg/dL (ref 8–27)
CO2: 22 mmol/L (ref 20–29)
CREATININE: 0.69 mg/dL (ref 0.57–1.00)
Calcium: 9.8 mg/dL (ref 8.7–10.3)
Chloride: 94 mmol/L — ABNORMAL LOW (ref 96–106)
GFR calc Af Amer: 105 mL/min/{1.73_m2} (ref >59)
GFR calc non Af Amer: 91 mL/min/{1.73_m2} (ref >59)
Globulin, Total: 3.4 g/dL (ref 1.5–4.5)
Glucose: 100 mg/dL — ABNORMAL HIGH (ref 65–99)
Potassium: 4.4 mmol/L (ref 3.5–5.2)
Sodium: 135 mmol/L (ref 134–144)
TOTAL PROTEIN: 8.1 g/dL (ref 6.0–8.5)

## 2017-05-26 LAB — HEMOGLOBIN A1C
Est. average glucose Bld gHb Est-mCnc: 117 mg/dL
Hgb A1c MFr Bld: 5.7 % — ABNORMAL HIGH (ref 4.8–5.6)

## 2017-07-20 ENCOUNTER — Other Ambulatory Visit: Payer: Self-pay | Admitting: Family Medicine

## 2017-11-14 ENCOUNTER — Other Ambulatory Visit: Payer: Self-pay | Admitting: Family Medicine

## 2017-11-14 DIAGNOSIS — Z1231 Encounter for screening mammogram for malignant neoplasm of breast: Secondary | ICD-10-CM

## 2017-11-19 NOTE — Patient Instructions (Signed)
It was a pleasure seeing you today.  You are currently up-to-date on immunizations as well as health maintenance.  Call the breast center and schedule the bone density study for the same day as your mammogram.  You will receive a phone call from [facility] imaging to schedule the MRI of your abdomen.  You can call your insurance company to check on the Shingrix vaccine if you decide you would like to get this.  You would need to get this at your pharmacy and possibly need to come by for a prescription.  We will call you with your lab results   GENERAL RECOMMENDATIONS FOR GOOD HEALTH:  Supplements:  . Take a daily baby Aspirin 81mg  at bedtime for heart health unless you have a history of gastrointestinal bleed, allergy to aspirin, or are already taking higher dose Aspirin or other antiplatelet or blood thinner medication.   . Consume 1200 mg of Calcium daily through dietary calcium or supplement if you are female age 20 or older, or men 14 and older.   Men aged 24-70 should consume 1000 mg of Calcium daily. . Take 600 IU of Vitamin D daily.  Take 800 IU of Calcium daily if you are older than age 27.  . Take a general multivitamin daily.   Healthy diet: Eat a variety of foods, including fruits, vegetables, vegetable protein such as beans, lentils, tofu, and grains, such as rice.  Limit meat or animal protein, but if you eat meat, choose leans cuts such as chicken, fish, or [place].  Drink plenty of water daily.  Decrease saturated fat in the diet, avoid lots of red meat, processed foods, sweets, fast foods, and fried foods.  Limit salt and caffeine intake.  Exercise: Aerobic exercise helps maintain good heart health. Weight bearing exercise helps keep bones and muscles working strong.  We recommend at least 30-40 minutes of exercise most days of the week.   Fall prevention: Falls are the leading cause of injuries, accidents, and accidental deaths in people over the age of 36. Falling is a  real threat to your ability to live on your own.  Causes include poor eyesight or poor hearing, illness, poor lighting, throw rugs, clutter in your home, and medication side effects causing dizziness or balance problems.  Such medications can include medications for depression, sleep problems, high blood pressure, diabetes, and heart conditions.   PREVENTION  Be sure your home is as safe as possible. Here are some tips:  Wear shoes with non-skid soles (not house slippers).   Be sure your home and outside area are well lit.   Use night lights throughout your house, including hallways and stairways.   Remove clutter and clean up spills on floors and walkways.   Remove throw rugs or fasten them to the floor with carpet tape. Tack down carpet edges.   Do not place electrical cords across pathways.   Install grab bars in your bathtub, shower, and toilet area. Towel bars should not be used as a grab bar.   Install handrails on both sides of stairways.   Do not climb on stools or stepladders. Get someone else to help with jobs that require climbing.   Do not wax your floors at all, or use a non-skid wax.   Repair uneven or unsafe sidewalks, walkways or stairs.   Keep frequently used items within reach.   Be aware of pets so you do not trip.  Get regular check-ups from your doctor, and take good care  of yourself:  Have your eyes checked every year for vision changes, cataracts, glaucoma, and other eye problems. Wear eyeglasses as directed.   Have your hearing checked every 2 years, or anytime you or others think that you cannot hear well. Use hearing aids as directed.   See your caregiver if you have foot pain or corns. Sore feet can contribute to falls.   Let your caregiver know if a medicine is making you feel dizzy or making you lose your balance.   Use a cane, walker, or wheelchair as directed. Use walker or wheelchair brakes when getting in and out.   When you get up from bed,  sit on the side of the bed for 1 to 2 minutes before you stand up. This will give your blood pressure time to adjust, and you will feel less dizzy.   If you need to go to the bathroom often, consider using a bedside commode.  Disease prevention:  If you smoke or chew tobacco, find out from your caregiver how to quit. It can literally save your life, no matter how long you have been a tobacco user. If you do not use tobacco, never begin. Medicare does cover some smoking cessation counseling.  Maintain a healthy diet and normal weight. Increased weight leads to problems with blood pressure and diabetes. We check your height, weight, and BMI as part of your yearly visit.  The Body Mass Index or BMI is a way of measuring how much of your body is fat. Having a BMI above 27 increases the risk of heart disease, diabetes, hypertension, stroke and other problems related to obesity. Your caregiver can help determine your BMI and based on it develop an exercise and dietary program to help you achieve or maintain this important measurement at a healthful level.  High blood pressure causes heart and blood vessel problems.  Persistent high blood pressure should be treated with medicine if weight loss and exercise do not work.  We check your blood pressure as part of your yearly visit.  Avoid drinking alcohol in excess (more than two drinks per day).  Avoid use of street drugs. Do not share needles with anyone. Ask for professional help if you need assistance or instructions on stopping the use of alcohol, cigarettes, and/or drugs.  Brush your teeth twice a day with fluoride toothpaste, and floss once a day. Good oral hygiene prevents tooth decay and gum disease. The problems can be painful, unattractive, and can cause other health problems. Visit your dentist for a routine oral and dental checkup and preventive care every 6-12 months.   See your eye doctor yearly for routine screening for things like  glaucoma.  Look at your skin regularly.  Use a mirror to look at your back. Notify your caregivers of changes in moles, especially if there are changes in shapes, colors, a size larger than a pencil eraser, an irregular border, or development of new moles.  Safety:  Use seatbelts 100% of the time, whether driving or as a passenger.  Use safety devices such as hearing protection if you work in environments with loud noise or significant background noise.  Use safety glasses when doing any work that could send debris in to the eyes.  Use a helmet if you ride a bike or motorcycle.  Use appropriate safety gear for contact sports.  Talk to your caregiver about gun safety.  Use sunscreen with a SPF (or skin protection factor) of 15 or greater.  Lighter skinned  people are at a greater risk of skin cancer. Don't forget to also wear sunglasses in order to protect your eyes from too much damaging sunlight. Damaging sunlight can accelerate cataract formation.   If you have multiple sexual partners, or if you are not in a monogamous relationship, practice safe sex. Use condoms. Condoms are used to help reduce the spread of sexually transmitted infections (or STIs).  Consider an HIV test if you have never been tested.  Consider routine screening for STIs if you have multiple sexual partners.   Keep carbon monoxide and smoke detectors in your home functioning at all times. Change the batteries every 6 months or use a model that plugs into the wall or is hard wired in.   END OF LIFE PLANNING/ADVANCED DIRECTIVES Advance health-care planning is deciding the kind of care you want at the end of life. While alert competent adults are able to exercise their rights to make health care and financial decisions, problems arise when an individual becomes unconscious, incapacitated, or otherwise unable to communicate or make such decisions. Advance health care directives are the legal documents in which you give written  instructions about your choices limited, aggressive or palliative care if, in the future, you cannot speak for yourself.  Advanced directives include the following: [place] allows you to appoint someone to act as your health care agent to make health care decisions for you should it be determined by your health care provider that you are no longer able to make these decisions for yourself.  A Living Will is a legal document in which you can declare that under certain conditions you desire your life not be prolonged by extraordinary or artificial means during your last illness or when you are near death. We can provide you with sample advanced directives, you can get an attorney to prepare these for you, or you can visit [place] Secretary of State's website for additional information and resources at [email or web address]  Further, I recommend you have an attorney prepare a Will and Durable Power of Attorney if you haven't done so already.  Please get [place] a copy of your health care Advanced Directives.   PREVENTATIV E CARE RECOMMENDATIONS:  Vaccinations: We recommend the following vaccinations as part of your preventative care:  Pneumococcal vaccine is recommended to protect against certain types of pneumonia.  This is normally recommended for adults age 28 or older once, or up to every 5 years for those at high risk.  The vaccine is also recommended for adults younger than 66 years old with certain underlying conditions that make them high risk for pneumonia.  Influenza vaccine is recommended to protect against seasonal influenza or "the flu." Influenza is a serious disease that can lead to hospitalization and sometimes even death. Traditional flu vaccines (called trivalent vaccines) are made to protect against three flu viruses; an influenza A (H1N1) virus, an influenza A (H3N2) virus, and an influenza B virus. In addition, there are flu vaccines made to protect  against four flu viruses (called "quadrivalent" vaccines). These vaccines protect against the same viruses as the trivalent vaccine and an additional B virus.  We recommend the high dose influenza vaccine to those 65 years and older.  Hepatitis B vaccine to protect against a form of infection of the liver by a virus acquired from blood or body fluids, particularly for high risk groups.  Td or Tdap vaccine to protect against Tetanus, diphtheria and pertussis which can be very serious.  These diseases are caused by bacteria.  Diphtheria and pertussis are spread from person to person through coughing or sneezing.  Tetanus enters the body through cuts, scratches, or wounds.  Tetanus (Lockjaw) causes painful muscle tightening and stiffness, usually all over the body.  Diphtheria can cause a thick coating to form in the back of the throat.  It can lead to breathing problems, paralysis, heart failure, and death.  Pertussis (Whooping Cough) causes severe coughing spells, which can cause difficulty breathing, vomiting and disturbed sleep.  Td or Tdap is usually given every 10 years.  Shingles vaccine to protect against Varicella Zoster if you are older than age 37, or younger than 66 years old with certain underlying illness.    Cancer Screening: Most routine colon cancer screening begins at the age of 14.  Subsequent colonoscopies are performed either every 5-10 years for normal screening, or every 2-5 years for higher risks patients, up until age 52 years of age. Annual screening is done with easy to use take-home tests to check for hidden blood in the stool called hemoccult tests.  Sigmoidoscopy or colonoscopy can detect the earliest forms of colon cancer and is life saving. These tests use a small camera at the end of a tube to directly examine the colon.   Pelvic Exam and Pap Smear: Pelvic exams and pap smears are performed routinely to evaluate for abnormalities as well as cancers including cervical and  vaginal cancers.  This is generally performed every 2-3 years for most women, or more frequently for higher risk patients.  Mammograms: Mammograms are used to screen for breast cancer.  Medicare covers baseline screening once from ages 32-29 years old, but will cover mammograms yearly for those 40 years and older.  In accordance with other guidelines, you may not need a mammogram every year though.  The decision on how frequently you need a mammogram should be discussed with you medical provider.    Osteoporosis Screening: Screening for osteoporosis usually begins at age 72 for women, and can be done as frequent as every 2 years.  However, women or men with higher risk of osteoporosis may be screened earlier than age 66.  Osteoporosis or low bone mass is diminished bone strength from alterations in bone architecture leading to bone fragility and increased fracture risk.     Cardiovascular Screening: Fat and cholesterol leaves deposits in your arteries that can block them. This causes heart disease and vessel disease elsewhere in your body.  If your cholesterol is found to be high, or if you have heart disease or certain other medical conditions, then you may need to have your cholesterol monitored frequently and be treated with medication. Cardiovascular screening in the form of lab tests for cholesterol, HDL and triglycerides can be done every 5 years.  A screening electrocardiogram can be done as part of the Welcome to Medicare physical.  Diabetes Screening: Diabetes screening can be done at least every 3 years for those with risk factors,  or every 6-63months for prediabetic patients.  Screening includes fasting blood sugar test or glucose tolerance test.  Risk factors include hypertension, dyslipidemia, obesity, previously abnormal glucose tests, family history of diabetes, age 39 years or older, and history of gestations diabetes.   AAA (abdominal aortic aneurysm) Screening: Medicare allows for a  one time ultrasound to screen for abdominal aortic aneurysm if done as a referral as part of the Welcome to Medicare exam.  Men eligible for this screening include those men between age  66-46 years of age who have smoked at least 100 cigarettes in his lifetime and/or has a family history of AAA.  HIV Screening:  Medicare allows for yearly screening for patients at high risk for contracting HIV disease.

## 2017-11-19 NOTE — Progress Notes (Addendum)
Alexandra Davies is a 67 y.o. female who presents for annual wellness visit and follow-up on chronic medical conditions.  She has the following concerns: Appetite has decreased. No early satiety. History of severe fatty liver disease and benign liver lesion that requires follow up. Last MRI in 2017 and it was stable at that time.  She does drink alcohol still, 1-2 drinks most days.   She is concerned that her husband's health is slowly deteriorating and she wants to be around to take care of him.  States they travel a lot. No kids.   Flu shot at [facility].   HTN- taking medication daily. Does not check BP at home.   GERD- takes omeprazole most days of the week.   Prediabetes- we are monitoring this. Diet is heavy on vegetables and not with sweets.  Osteopenia- more than 2 years since last DEXA. She does not take a vitamin D supplement. History of vitamin D deficiency.  HL- has declined statin in the past.   Seasonal allergies and no issues currently. She is not taking allergy medications now.    Immunization History  Administered Date(s) Administered  . Influenza,inj,Quad PF,6+ Mos 09/17/2014, 10/19/2015  . Influenza-Unspecified 10/30/2016  . Pneumococcal Conjugate-13 11/15/2016, 11/20/2017  . Tdap 01/16/2009  . Zoster 10/21/2015   Last Pap smear:  two years ago  Last mammogram: sch. 12-26-17 Last colonoscopy:07-01-14 Last DEXA: 10-20-15 Dentist: 10/2017 Ophtho: two years ago Exercise: walking   Other doctors caring for patient include:  Dermatology- Joseph Pierini- [facility] Dr. Paulita Fujita - sees him every 5 years  Hematology- Dr. Alen Blew    Depression screen:  See questionnaire below.  Depression screen [facility] 2/9 11/20/2017 11/15/2016 10/19/2015 09/17/2014  Decreased Interest 0 0 0 0  Down, Depressed, Hopeless 0 0 0 0  PHQ - 2 Score 0 0 0 0    Fall Risk Screen: see questionnaire below. Fall Risk  11/20/2017 11/15/2016 10/19/2015 09/17/2014  Falls in the past year? 0  No No No  Risk for fall due to : - - - History of fall(s)    ADL screen:  See questionnaire below Functional Status Survey: Is the patient deaf or have difficulty hearing?: No Does the patient have difficulty seeing, even when wearing glasses/contacts?: No Does the patient have difficulty concentrating, remembering, or making decisions?: No Does the patient have difficulty walking or climbing stairs?: No Does the patient have difficulty dressing or bathing?: No Does the patient have difficulty doing errands alone such as visiting a doctor's office or shopping?: No   End of Life Discussion:  Patient does not have  a living will and medical power of attorney ( Husband) is next of kin. MOST form filled out today with patient.   Review of Systems Constitutional: -fever, -chills, -sweats, -unexpected weight change, -anorexia, -fatigue Allergy: -sneezing, -itching, -congestion Dermatology: denies changing moles, rash, lumps, new worrisome lesions ENT: -runny nose, -ear pain, -sore throat, -hoarseness, -sinus pain, -teeth pain, -tinnitus, -hearing loss, -epistaxis Cardiology:  -chest pain, -palpitations, -edema, -orthopnea, -paroxysmal nocturnal dyspnea Respiratory: -cough, -shortness of breath, -dyspnea on exertion, -wheezing, -hemoptysis Gastroenterology: -abdominal pain, -nausea, -vomiting, -diarrhea, -constipation, -blood in stool, -changes in bowel movement, -dysphagia Hematology: -bleeding or bruising problems Musculoskeletal: -arthralgias, -myalgias, -joint swelling, -back pain, -neck pain, -cramping, -gait changes Ophthalmology: -vision changes, -eye redness, -itching, -discharge Urology: -dysuria, -difficulty urinating, -hematuria, -urinary frequency, -urgency, incontinence Neurology: -headache, -weakness, -tingling, -numbness, -speech abnormality, -memory loss, -falls, -dizziness Psychology:  -depressed mood, -agitation, -sleep problems  PHYSICAL EXAM:  BP 126/86 (BP Location:  Left Arm, Patient Position: Sitting)   Pulse 82   Temp (!) 97.5 F (36.4 C)   Ht 5\' 1"  (1.549 m)   Wt 156 lb 9.6 oz (71 kg)   SpO2 98%   BMI 29.59 kg/m   General Appearance: Alert, cooperative, no distress, appears stated age Head: Normocephalic, without obvious abnormality, atraumatic Eyes: PERRL, conjunctiva/corneas clear, EOM's intact, fundi benign Ears: Normal TM's and external ear canals Nose: Nares normal, mucosa normal, no drainage or sinus tenderness Throat: Lips, mucosa, and tongue normal; teeth and gums normal Neck: Supple, no lymphadenopathy; thyroid: no enlargement/tenderness/nodules; no carotid bruit or JVD Back: Spine nontender, no curvature, ROM normal, no CVA tenderness Lungs: Clear to auscultation bilaterally without wheezes, rales or ronchi; respirations unlabored Chest Wall: No tenderness or deformity Heart: Regular rate and rhythm, S1 and S2 normal, no murmur, rub or gallop Breast Exam: declines. Mammogram scheduled.  Abdomen: Soft, non-tender, nondistended, normoactive bowel sounds, no masses, no hepatosplenomegaly Genitalia: declines. Pap not due.  Extremities: No clubbing, cyanosis or edema Pulses: 2+ and symmetric all extremities Skin: Skin color, texture, turgor normal, no rashes or lesions Lymph nodes: Cervical, supraclavicular, and axillary nodes normal Neurologic: CNII-XII intact, normal strength, sensation and gait; reflexes 2+ and symmetric throughout Psych: Normal mood, affect, hygiene and grooming.  ASSESSMENT/PLAN: Medicare annual wellness visit, subsequent - Plan: POCT Urinalysis DIP (Proadvantage Device)  Essential hypertension - Plan: POCT Urinalysis DIP (Proadvantage Device), CBC with Differential/Platelet, Comprehensive metabolic panel  Liver tumor (benign) - Plan: Comprehensive metabolic panel, MR Abdomen W Wo Contrast  Hepatic steatosis - Plan: Comprehensive metabolic panel, MR Abdomen W Wo Contrast  Mixed hyperlipidemia  Prediabetes -  Plan: POCT Urinalysis DIP (Proadvantage Device), Comprehensive metabolic panel, TSH  Osteopenia determined by x-ray - Plan: DG Bone Density  Gastroesophageal reflux disease, esophagitis presence not specified  Advance directive discussed with patient  Immunization counseling  Need for vaccination against Streptococcus pneumoniae - Plan: Pneumococcal conjugate vaccine 13-valent IM  Seasonal allergic rhinitis due to pollen  Vitamin D deficiency - Plan: VITAMIN D 25 Hydroxy (Vit-D Deficiency, Fractures)  Liver disease - Plan: Comprehensive metabolic panel, MR Abdomen W Wo Contrast  Medication management  Estrogen deficiency - Plan: DG Bone Density  She appears to be doing well overall physically and emotionally.  She does have concerns regarding her husband's health and a slow deterioration.  They do manage to continue with their traveling and enjoying life. We are monitoring a benign liver tumor that was pressing on her vena cava.  And severe fatty liver disease.  It is been over 2 years since her previous MRI.  We will order this today. She is asymptomatic.  She does drink 1-2 alcoholic drinks per evening most days. She is aware that she should stop alcohol use to protect her liver function. Prediabetes-this has been stable.  Counseling done on healthy diet and exercise Hypertension-blood pressure is in goal range today continue on current medications.  No obvious side effects. GERD-counseling done on modified lifestyle in order to reduce use of PPI.  Discussed potential long-term side effects of PPIs Vitamin D deficiency and osteopenia.  Check vitamin D level and consider supplement.  Discussed weightbearing exercise as well as adequate calcium intake to prevent worsening bone loss.  Is been over 2 years since her last bone density test.  We will order another to compare. Allergies are not bothersome at this time a year so she is not on any allergy  medication at this time. Advanced  directive counseling.  The MOST form was filled out today and she will take home living will and healthcare power of attorney documentation to discuss with her husband. Immunizations reviewed and she will receive Pneumovax 23 today. Follow-up pending labs  Addendum: patient actually receive prevnar 13 vaccine at her visit and not pneumovax 23 as documented due to miscommunication between this provider and CMA. Patient made aware and will be credited Prevnar 13. She will still need pneumovax in 2020.    Discussed monthly self breast exams and yearly mammograms; at least 30 minutes of aerobic activity at least 5 days/week and weight-bearing exercise 2x/week; proper sunscreen use reviewed; healthy diet, including goals of calcium and vitamin D intake and alcohol recommendations (less than or equal to 1 drink/day) reviewed; regular seatbelt use; changing batteries in smoke detectors.  Immunization recommendations discussed.  Colonoscopy recommendations reviewed   Medicare Attestation I have personally reviewed: The patient's medical and social history Their use of alcohol, tobacco or illicit drugs Their current medications and supplements The patient's functional ability including ADLs,fall risks, home safety risks, cognitive, and hearing and visual impairment Diet and physical activities Evidence for depression or mood disorders   The patient's weight, height, and BMI have been recorded in the chart.  I have made referrals, counseling, and provided education to the patient based on review of the above and I have provided the patient with a written personalized care plan for preventive services.     Harland Dingwall, NP-C   11/20/2017

## 2017-11-20 ENCOUNTER — Encounter: Payer: Self-pay | Admitting: Family Medicine

## 2017-11-20 ENCOUNTER — Ambulatory Visit: Payer: Medicare Other | Admitting: Family Medicine

## 2017-11-20 VITALS — BP 126/86 | HR 82 | Temp 97.5°F | Ht 61.0 in | Wt 156.6 lb

## 2017-11-20 DIAGNOSIS — R7303 Prediabetes: Secondary | ICD-10-CM | POA: Diagnosis not present

## 2017-11-20 DIAGNOSIS — K760 Fatty (change of) liver, not elsewhere classified: Secondary | ICD-10-CM

## 2017-11-20 DIAGNOSIS — E2839 Other primary ovarian failure: Secondary | ICD-10-CM | POA: Insufficient documentation

## 2017-11-20 DIAGNOSIS — Z7189 Other specified counseling: Secondary | ICD-10-CM | POA: Diagnosis not present

## 2017-11-20 DIAGNOSIS — M8580 Other specified disorders of bone density and structure, unspecified site: Secondary | ICD-10-CM

## 2017-11-20 DIAGNOSIS — I10 Essential (primary) hypertension: Secondary | ICD-10-CM | POA: Diagnosis not present

## 2017-11-20 DIAGNOSIS — Z0000 Encounter for general adult medical examination without abnormal findings: Secondary | ICD-10-CM | POA: Diagnosis not present

## 2017-11-20 DIAGNOSIS — D134 Benign neoplasm of liver: Secondary | ICD-10-CM

## 2017-11-20 DIAGNOSIS — Z23 Encounter for immunization: Secondary | ICD-10-CM | POA: Diagnosis not present

## 2017-11-20 DIAGNOSIS — K769 Liver disease, unspecified: Secondary | ICD-10-CM

## 2017-11-20 DIAGNOSIS — E559 Vitamin D deficiency, unspecified: Secondary | ICD-10-CM

## 2017-11-20 DIAGNOSIS — Z7185 Encounter for immunization safety counseling: Secondary | ICD-10-CM

## 2017-11-20 DIAGNOSIS — J301 Allergic rhinitis due to pollen: Secondary | ICD-10-CM

## 2017-11-20 DIAGNOSIS — E782 Mixed hyperlipidemia: Secondary | ICD-10-CM

## 2017-11-20 DIAGNOSIS — Z79899 Other long term (current) drug therapy: Secondary | ICD-10-CM

## 2017-11-20 DIAGNOSIS — K219 Gastro-esophageal reflux disease without esophagitis: Secondary | ICD-10-CM

## 2017-11-20 LAB — POCT URINALYSIS DIP (PROADVANTAGE DEVICE)
BILIRUBIN UA: NEGATIVE mg/dL
Bilirubin, UA: NEGATIVE
GLUCOSE UA: NEGATIVE mg/dL
Leukocytes, UA: NEGATIVE
Nitrite, UA: NEGATIVE
Protein Ur, POC: NEGATIVE mg/dL
RBC UA: NEGATIVE
SPECIFIC GRAVITY, URINE: 1.01
UUROB: 3.5
pH, UA: 7 (ref 5.0–8.0)

## 2017-11-21 LAB — VITAMIN D 25 HYDROXY (VIT D DEFICIENCY, FRACTURES): VIT D 25 HYDROXY: 26.8 ng/mL — AB (ref 30.0–100.0)

## 2017-11-21 LAB — COMPREHENSIVE METABOLIC PANEL
A/G RATIO: 1.6 (ref 1.2–2.2)
ALK PHOS: 90 IU/L (ref 39–117)
ALT: 32 IU/L (ref 0–32)
AST: 26 IU/L (ref 0–40)
Albumin: 4.6 g/dL (ref 3.6–4.8)
BUN/Creatinine Ratio: 12 (ref 12–28)
BUN: 9 mg/dL (ref 8–27)
Bilirubin Total: 0.5 mg/dL (ref 0.0–1.2)
CALCIUM: 9.6 mg/dL (ref 8.7–10.3)
CO2: 27 mmol/L (ref 20–29)
Chloride: 98 mmol/L (ref 96–106)
Creatinine, Ser: 0.74 mg/dL (ref 0.57–1.00)
GFR calc Af Amer: 98 mL/min/{1.73_m2} (ref >59)
GFR, EST NON AFRICAN AMERICAN: 85 mL/min/{1.73_m2} (ref >59)
Globulin, Total: 2.9 g/dL (ref 1.5–4.5)
Glucose: 104 mg/dL — ABNORMAL HIGH (ref 65–99)
Potassium: 4.1 mmol/L (ref 3.5–5.2)
Sodium: 139 mmol/L (ref 134–144)
Total Protein: 7.5 g/dL (ref 6.0–8.5)

## 2017-11-21 LAB — CBC WITH DIFFERENTIAL/PLATELET
Basophils Absolute: 0.1 10*3/uL (ref 0.0–0.2)
Basos: 1 %
EOS (ABSOLUTE): 0.2 10*3/uL (ref 0.0–0.4)
Eos: 3 %
Hematocrit: 46.1 % (ref 34.0–46.6)
Hemoglobin: 15.4 g/dL (ref 11.1–15.9)
IMMATURE GRANULOCYTES: 0 %
Immature Grans (Abs): 0 10*3/uL (ref 0.0–0.1)
LYMPHS ABS: 2.5 10*3/uL (ref 0.7–3.1)
Lymphs: 30 %
MCH: 30.3 pg (ref 26.6–33.0)
MCHC: 33.4 g/dL (ref 31.5–35.7)
MCV: 91 fL (ref 79–97)
MONOS ABS: 0.6 10*3/uL (ref 0.1–0.9)
Monocytes: 8 %
NEUTROS PCT: 58 %
Neutrophils Absolute: 4.8 10*3/uL (ref 1.4–7.0)
PLATELETS: 293 10*3/uL (ref 150–450)
RBC: 5.08 x10E6/uL (ref 3.77–5.28)
RDW: 12.2 % — ABNORMAL LOW (ref 12.3–15.4)
WBC: 8.3 10*3/uL (ref 3.4–10.8)

## 2017-11-21 LAB — TSH: TSH: 0.824 u[IU]/mL (ref 0.450–4.500)

## 2017-11-26 ENCOUNTER — Other Ambulatory Visit: Payer: Self-pay | Admitting: Family Medicine

## 2017-12-13 ENCOUNTER — Ambulatory Visit
Admission: RE | Admit: 2017-12-13 | Discharge: 2017-12-13 | Disposition: A | Discharge status: Home / Self Care | Payer: Medicare Other | Source: Ambulatory Visit | Attending: Family Medicine | Admitting: Family Medicine

## 2017-12-13 DIAGNOSIS — K760 Fatty (change of) liver, not elsewhere classified: Secondary | ICD-10-CM

## 2017-12-13 DIAGNOSIS — K769 Liver disease, unspecified: Secondary | ICD-10-CM

## 2017-12-13 DIAGNOSIS — D134 Benign neoplasm of liver: Secondary | ICD-10-CM

## 2017-12-13 MED ORDER — GADOBENATE DIMEGLUMINE 529 MG/ML IV SOLN
14.0000 mL | Freq: Once | INTRAVENOUS | Status: AC | PRN
  Administered 2017-12-13: 14 mL via INTRAVENOUS

## 2017-12-14 ENCOUNTER — Encounter: Payer: Self-pay | Admitting: Family Medicine

## 2017-12-14 DIAGNOSIS — K8020 Calculus of gallbladder without cholecystitis without obstruction: Secondary | ICD-10-CM | POA: Insufficient documentation

## 2017-12-14 DIAGNOSIS — I700 Atherosclerosis of aorta: Secondary | ICD-10-CM | POA: Insufficient documentation

## 2017-12-14 HISTORY — DX: Calculus of gallbladder without cholecystitis without obstruction: K80.20

## 2017-12-14 HISTORY — DX: Atherosclerosis of aorta: I70.0

## 2017-12-15 ENCOUNTER — Other Ambulatory Visit: Payer: Self-pay | Admitting: Internal Medicine

## 2017-12-15 MED ORDER — ATORVASTATIN CALCIUM 10 MG PO TABS: 10.0000 mg | ORAL_TABLET | Freq: Every day | ORAL | 0 refills | Status: DC

## 2017-12-26 ENCOUNTER — Ambulatory Visit
Admission: RE | Admit: 2017-12-26 | Discharge: 2017-12-26 | Disposition: A | Discharge status: Home / Self Care | Payer: Medicare Other | Source: Ambulatory Visit | Attending: Family Medicine | Admitting: Family Medicine

## 2017-12-26 DIAGNOSIS — Z1231 Encounter for screening mammogram for malignant neoplasm of breast: Secondary | ICD-10-CM

## 2018-01-01 ENCOUNTER — Ambulatory Visit
Admission: RE | Admit: 2018-01-01 | Discharge: 2018-01-01 | Disposition: A | Discharge status: Home / Self Care | Payer: Medicare Other | Source: Ambulatory Visit | Attending: Family Medicine | Admitting: Family Medicine

## 2018-01-01 DIAGNOSIS — E2839 Other primary ovarian failure: Secondary | ICD-10-CM

## 2018-01-01 DIAGNOSIS — M8580 Other specified disorders of bone density and structure, unspecified site: Secondary | ICD-10-CM

## 2018-01-26 ENCOUNTER — Other Ambulatory Visit: Payer: Self-pay | Admitting: Family Medicine

## 2018-05-23 ENCOUNTER — Other Ambulatory Visit: Payer: Self-pay | Admitting: Family Medicine

## 2018-06-26 ENCOUNTER — Other Ambulatory Visit: Payer: Self-pay | Admitting: Family Medicine

## 2018-07-10 ENCOUNTER — Other Ambulatory Visit: Payer: Self-pay

## 2018-07-10 ENCOUNTER — Encounter: Payer: Self-pay | Admitting: Family Medicine

## 2018-07-10 ENCOUNTER — Ambulatory Visit: Payer: Medicare Other | Admitting: Family Medicine

## 2018-07-10 VITALS — BP 128/80 | HR 77 | Temp 98.4°F | Wt 156.6 lb

## 2018-07-10 DIAGNOSIS — H6121 Impacted cerumen, right ear: Secondary | ICD-10-CM | POA: Diagnosis not present

## 2018-07-10 DIAGNOSIS — J301 Allergic rhinitis due to pollen: Secondary | ICD-10-CM | POA: Diagnosis not present

## 2018-07-10 NOTE — Progress Notes (Signed)
   Subjective:    Patient ID: Alexandra Davies, female    DOB: 1950/03/27, 68 y.o.   MRN: [id number]  HPI Chief Complaint  Patient presents with  . ear pain    right ear issues, nasal congestion, sinus drainage, no cough, ear stopped up   Here with complaints of decreased hearing in her right ear due to wax. She also reports having her usual allergy symptoms. She takes Xyzal and Flonase as needed.   Denies fever, chills, headache, dizziness, cough, N/V/D.    Review of Systems Pertinent positives and negatives in the history of present illness.     Objective:   Physical Exam BP 128/80   Pulse 77   Temp 98.4 F (36.9 C) (Oral)   Wt 156 lb 9.6 oz (71 kg)   BMI 29.59 kg/m   Alert and in no distress. Right TM is not visible due to cerumen impaction prior to ear lavage. Tympanic membranes and canals are normal post lavage. Neck is supple without adenopathy or thyromegaly.       Assessment & Plan:  Right ear impacted cerumen - Plan: ear lavage performed by Sabrina, CMA and she tolerated this well. Reported improvement in hearing post lavage. Normal exam post lavage  Seasonal allergic rhinitis due to pollen - Plan: continue with Xyzal and Flonase.

## 2018-08-21 ENCOUNTER — Other Ambulatory Visit: Payer: Self-pay | Admitting: Family Medicine

## 2018-11-15 ENCOUNTER — Other Ambulatory Visit: Payer: Self-pay | Admitting: Family Medicine

## 2018-11-15 NOTE — Telephone Encounter (Signed)
Pt has upcoming appt 

## 2018-11-21 NOTE — Progress Notes (Signed)
Alexandra Davies is a 68 y.o. female who presents for annual wellness visit and follow-up on chronic medical conditions.  She has the following concerns:  States her right ear feels full of wax and sounds muffled.   HTN-reports good daily compliance with Maxide 25 and no side effects.   Prediabetes-due for recheck of hemoglobin A1c  HL- states she only took a statin for 3 months and stopped. She did not want to continue. Does not like medication but is willing to start back if needed.   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  Does not take NSAIDs regularly.  She had an MRI for follow-up in December 2019 and this showed the liver lesion resolving compared to previous MRIs.  It did show fatty liver.  This has been present for years.  MRI abdomen also showed aortic atherosclerosis as well as gallstones.  She is aware.  Depression-she has been feeling more depressed lately due to her husband having cancer and getting treatments.  She is the caregiver.  She is not currently on medication.  She has a good support network and her friends.  She is doing things she enjoys.  She is not seeing a therapist  Vitamin D def-reports taking a multi-vitamin with D   GERD-states she takes omeprazole as needed  Osteopenia- T -score -2.1 in December 2019    Immunization History  Administered Date(s) Administered  . Influenza, High Dose Seasonal PF 09/22/2017, 11/05/2018  . Influenza,inj,Quad PF,6+ Mos 09/17/2014, 10/19/2015  . Influenza-Unspecified 11/19/2012, 10/30/2016  . Pneumococcal Conjugate-13 11/15/2016, 11/20/2017  . Tdap 01/16/2009, 11/22/2018  . Zoster 10/21/2015   Last Pap smear: 2017.  Declines pelvic exam today Last mammogram: December 2019 Last colonoscopy: 2016. [facility]  Last DEXA: December 2019.  Dentist: next Thursday  Ophtho: this year Exercise: none  Other doctors caring for patient include: Dermatology- [facility] Hematologist- Dr. Alen Blew   GI- Dr. Stacie [facility]   Depression screen:  See questionnaire below.  Depression screen [facility] 2/9 11/22/2018 11/20/2017 11/15/2016 10/19/2015 09/17/2014  Decreased Interest 0 0 0 0 0  Down, Depressed, Hopeless 0 0 0 0 0  PHQ - 2 Score 0 0 0 0 0    Fall Risk Screen: see questionnaire below. Fall Risk  11/22/2018 11/20/2017 11/15/2016 10/19/2015 09/17/2014  Falls in the past year? 0 0 No No No  Number falls in past yr: 0 - - - -  Injury with Fall? 0 - - - -  Risk for fall due to : - - - - History of fall(s)    ADL screen:  See questionnaire below Functional Status Survey: Is the patient deaf or have difficulty hearing?: Yes(muffled) Does the patient have difficulty seeing, even when wearing glasses/contacts?: No Does the patient have difficulty concentrating, remembering, or making decisions?: No Does the patient have difficulty walking or climbing stairs?: No Does the patient have difficulty dressing or bathing?: No Does the patient have difficulty doing errands alone such as visiting a doctor's office or shopping?: No   End of Life Discussion:  Patient has a living will and medical power of attorney.MOST form reviewed and updated.  Review of Systems Constitutional: -fever, -chills, -sweats, -unexpected weight change, -anorexia, -fatigue Allergy: -sneezing, -itching, -congestion Dermatology: denies changing moles, rash, lumps, new worrisome lesions ENT: -runny nose, +hearing decreased in right ear, -ear pain, -sore throat, -hoarseness, -sinus pain, -teeth pain, -tinnitus Cardiology:  -chest pain, -palpitations, -edema, -orthopnea, -paroxysmal nocturnal dyspnea Respiratory: -cough, -shortness  of breath, -dyspnea on exertion, -wheezing, -hemoptysis Gastroenterology: -abdominal pain, -nausea, -vomiting, -diarrhea, -constipation, -blood in stool, -changes in bowel movement, -dysphagia Hematology: -bleeding or bruising problems Musculoskeletal: -arthralgias, -myalgias, -joint swelling,  -back pain, -neck pain, -cramping, -gait changes Ophthalmology: -vision changes, -eye redness, -itching, -discharge Urology: -dysuria, -difficulty urinating, -hematuria, -urinary frequency, -urgency, incontinence Neurology: -headache, -weakness, -tingling, -numbness, -speech abnormality, -memory loss, -falls, -dizziness Psychology:  +depressed mood, -agitation, -sleep problems    PHYSICAL EXAM:  BP 120/78   Pulse 70   Temp 97.8 F (36.6 C)   Ht 5\' 1"  (1.549 m)   Wt 156 lb (70.8 kg)   BMI 29.48 kg/m   General Appearance: Alert, cooperative, no distress, appears stated age Head: Normocephalic, without obvious abnormality, atraumatic Eyes: PERRL, conjunctiva/corneas clear, EOM's intact Ears: right ear with cerumen impaction on initial exam. Normal TM's and external ear canals post lavage.  Nose: mask in place  Throat: mask in place  Neck: Supple, no lymphadenopathy; thyroid: no enlargement/tenderness/nodules; no carotid bruit or JVD Back: Spine nontender, no curvature, ROM normal, no CVA tenderness Lungs: Clear to auscultation bilaterally without wheezes, rales or ronchi; respirations unlabored Chest Wall: No tenderness or deformity Heart: Regular rate and rhythm, S1 and S2 normal, no murmur, rub or gallop Breast Exam: declines  Abdomen: Soft, non-tender, nondistended, normoactive bowel sounds, no masses, no hepatosplenomegaly Genitalia: declines  Extremities: No clubbing, cyanosis or edema Pulses: 2+ and symmetric all extremities Skin: Skin color, texture, turgor normal, no rashes or lesions Lymph nodes: Cervical, supraclavicular, and axillary nodes normal Neurologic: CNII-XII intact, normal strength, sensation and gait; reflexes 2+ and symmetric throughout Psych: slightly depressed mood, affect, hygiene and grooming.  ASSESSMENT/PLAN: Medicare annual wellness visit, subsequent -Here today for annual wellness visit.  No falls, issues with ADLs or concerns regarding memory.   Discussed advanced directives.  Discussed health promotion  Routine general medical examination at a health care facility - Plan: CBC with Differential, Comprehensive metabolic panel, TSH, T4, Free -Counseling on preventive healthcare.  She will call and schedule her mammogram is due next month.  Up-to-date on Pap smear and colonoscopy.  Immunizations reviewed and updated.  Essential hypertension - Plan: CBC with Differential, Comprehensive metabolic panel -Well-controlled on medication.  Continue on medication and checking blood pressures  Mixed hyperlipidemia - Plan: Lipid Panel -She has declined a statin in the past however she did take Lipitor for 3 months and stopped it in May 2020.  In-depth counseling regarding risk factors for heart disease and she is willing to start back on a statin.  Check lipids and prescribe appropriate statin therapy.  She will return in 6 weeks for fasting lipids and ALT.  Aortic atherosclerosis (HCC) - Plan: Lipid Panel -Counseling on risk factors for heart disease and statin therapy needed to help reduce her risks.  She has known atherosclerosis.  No sign of peripheral vascular disease based on exam  Prediabetes - Plan: Comprehensive metabolic panel, Hemoglobin A1c -Follow-up pending results  Hepatic steatosis -Reviewed MRI from December 2019 with patient.  Discussed avoiding alcohol and NSAIDs and weight loss to prevent this from worsening  Vitamin D deficiency - Plan: Vitamin D, 25-hydroxy -Check vitamin D level and adjust supplement as needed  Osteopenia determined by x-ray -Counseling on getting adequate calcium in her diet, vitamin D supplement and weightbearing exercises  Liver tumor (benign) -This had improved based on MRI from December 2019  Depression, unspecified depression type -Recommend counseling.  She is not in favor of medication.  Encouraged her  to make time for herself by doing things she enjoys  Right ear impacted cerumen -Right  ear lavage performed by CMA due to cerumen impaction.  She tolerated this well and reported improvement in symptoms.  Normal TM exam post lavage  Gastroesophageal reflux disease, unspecified whether esophagitis present -Continue on PPI as needed and encouraged her to limit usage  Need for Tdap vaccination - Plan: Tdap vaccine greater than or equal to 7yo IM -Counseling done on all components of the vaccine   Discussed monthly self breast exams and yearly mammograms; at least 30 minutes of aerobic activity at least 5 days/week and weight-bearing exercise 2x/week; proper sunscreen use reviewed; healthy diet, including goals of calcium and vitamin D intake and alcohol recommendations (less than or equal to 1 drink/day) reviewed; regular seatbelt use; changing batteries in smoke detectors.  Immunization recommendations discussed.  Colonoscopy recommendations reviewed   Medicare Attestation I have personally reviewed: The patient's medical and social history Their use of alcohol, tobacco or illicit drugs Their current medications and supplements The patient's functional ability including ADLs,fall risks, home safety risks, cognitive, and hearing and visual impairment Diet and physical activities Evidence for depression or mood disorders  The patient's weight, height, and BMI have been recorded in the chart.  I have made referrals, counseling, and provided education to the patient based on review of the above and I have provided the patient with a written personalized care plan for preventive services.     Harland Dingwall, NP-C   11/22/2018

## 2018-11-21 NOTE — Patient Instructions (Addendum)
  Alexandra Davies , Thank you for taking time to come for your Medicare Wellness Visit. I appreciate your ongoing commitment to your health goals. Please review the following plan we discussed and let me know if I can assist you in the future.   I will send in the statin prescription to help lower your cholesterol. Consider increasing the fiber in your diet or starting on a fiber supplement as discussed.  You will need to have a lab visit 6 weeks after starting on the statin to recheck your lipids.   Call and schedule your mammogram.   Take time to do things you enjoy!    This is a list of the screening recommended for you and due dates:  Health Maintenance  Topic Date Due  . Flu Shot  08/11/2018  . Pneumonia vaccines (2 of 2 - PPSV23) 11/21/2018  . Tetanus Vaccine  01/17/2019  . Colon Cancer Screening  07/01/2019  . Mammogram  12/27/2019  . DEXA scan (bone density measurement)  Completed  .  Hepatitis C: One time screening is recommended by Center for Disease Control  (CDC) for  adults born from [birth year] through 1964.   Completed

## 2018-11-22 ENCOUNTER — Encounter: Payer: Self-pay | Admitting: Family Medicine

## 2018-11-22 ENCOUNTER — Other Ambulatory Visit: Payer: Self-pay

## 2018-11-22 ENCOUNTER — Ambulatory Visit: Payer: Medicare Other | Admitting: Family Medicine

## 2018-11-22 VITALS — BP 120/78 | HR 70 | Temp 97.8°F | Ht 61.0 in | Wt 156.0 lb

## 2018-11-22 DIAGNOSIS — I700 Atherosclerosis of aorta: Secondary | ICD-10-CM

## 2018-11-22 DIAGNOSIS — F329 Major depressive disorder, single episode, unspecified: Secondary | ICD-10-CM

## 2018-11-22 DIAGNOSIS — I10 Essential (primary) hypertension: Secondary | ICD-10-CM

## 2018-11-22 DIAGNOSIS — R7303 Prediabetes: Secondary | ICD-10-CM | POA: Diagnosis not present

## 2018-11-22 DIAGNOSIS — Z0000 Encounter for general adult medical examination without abnormal findings: Secondary | ICD-10-CM | POA: Diagnosis not present

## 2018-11-22 DIAGNOSIS — H6121 Impacted cerumen, right ear: Secondary | ICD-10-CM | POA: Diagnosis not present

## 2018-11-22 DIAGNOSIS — Z23 Encounter for immunization: Secondary | ICD-10-CM | POA: Diagnosis not present

## 2018-11-22 DIAGNOSIS — F32A Depression, unspecified: Secondary | ICD-10-CM

## 2018-11-22 DIAGNOSIS — E782 Mixed hyperlipidemia: Secondary | ICD-10-CM | POA: Diagnosis not present

## 2018-11-22 DIAGNOSIS — E559 Vitamin D deficiency, unspecified: Secondary | ICD-10-CM

## 2018-11-22 DIAGNOSIS — K760 Fatty (change of) liver, not elsewhere classified: Secondary | ICD-10-CM

## 2018-11-22 DIAGNOSIS — K219 Gastro-esophageal reflux disease without esophagitis: Secondary | ICD-10-CM

## 2018-11-22 DIAGNOSIS — D134 Benign neoplasm of liver: Secondary | ICD-10-CM

## 2018-11-22 DIAGNOSIS — M8580 Other specified disorders of bone density and structure, unspecified site: Secondary | ICD-10-CM

## 2018-11-23 ENCOUNTER — Other Ambulatory Visit: Payer: Self-pay | Admitting: Internal Medicine

## 2018-11-23 LAB — HEMOGLOBIN A1C
Est. average glucose Bld gHb Est-mCnc: 123 mg/dL
Hgb A1c MFr Bld: 5.9 % — ABNORMAL HIGH (ref 4.8–5.6)

## 2018-11-23 LAB — COMPREHENSIVE METABOLIC PANEL
ALT: 29 IU/L (ref 0–32)
AST: 24 IU/L (ref 0–40)
Albumin/Globulin Ratio: 1.5 (ref 1.2–2.2)
Albumin: 4.6 g/dL (ref 3.8–4.8)
Alkaline Phosphatase: 86 IU/L (ref 39–117)
BUN/Creatinine Ratio: 15 (ref 12–28)
BUN: 11 mg/dL (ref 8–27)
Bilirubin Total: 0.4 mg/dL (ref 0.0–1.2)
CO2: 25 mmol/L (ref 20–29)
Calcium: 9.8 mg/dL (ref 8.7–10.3)
Chloride: 99 mmol/L (ref 96–106)
Creatinine, Ser: 0.73 mg/dL (ref 0.57–1.00)
GFR calc Af Amer: 99 mL/min/{1.73_m2} (ref >59)
GFR calc non Af Amer: 85 mL/min/{1.73_m2} (ref >59)
Globulin, Total: 3.1 g/dL (ref 1.5–4.5)
Glucose: 99 mg/dL (ref 65–99)
Potassium: 4.4 mmol/L (ref 3.5–5.2)
Sodium: 140 mmol/L (ref 134–144)
Total Protein: 7.7 g/dL (ref 6.0–8.5)

## 2018-11-23 LAB — CBC WITH DIFFERENTIAL/PLATELET
Basophils Absolute: 0.1 10*3/uL (ref 0.0–0.2)
Basos: 1 %
EOS (ABSOLUTE): 0.2 10*3/uL (ref 0.0–0.4)
Eos: 3 %
Hematocrit: 45.1 % (ref 34.0–46.6)
Hemoglobin: 15.3 g/dL (ref 11.1–15.9)
Immature Grans (Abs): 0 10*3/uL (ref 0.0–0.1)
Immature Granulocytes: 0 %
Lymphocytes Absolute: 2.4 10*3/uL (ref 0.7–3.1)
Lymphs: 31 %
MCH: 30.7 pg (ref 26.6–33.0)
MCHC: 33.9 g/dL (ref 31.5–35.7)
MCV: 91 fL (ref 79–97)
Monocytes Absolute: 0.5 10*3/uL (ref 0.1–0.9)
Monocytes: 7 %
Neutrophils Absolute: 4.5 10*3/uL (ref 1.4–7.0)
Neutrophils: 58 %
Platelets: 279 10*3/uL (ref 150–450)
RBC: 4.98 x10E6/uL (ref 3.77–5.28)
RDW: 12.3 % (ref 11.7–15.4)
WBC: 7.7 10*3/uL (ref 3.4–10.8)

## 2018-11-23 LAB — LIPID PANEL
Chol/HDL Ratio: 3.6 ratio (ref 0.0–4.4)
Cholesterol, Total: 211 mg/dL — ABNORMAL HIGH (ref 100–199)
HDL: 59 mg/dL (ref >39)
LDL Chol Calc (NIH): 132 mg/dL — ABNORMAL HIGH (ref 0–99)
Triglycerides: 115 mg/dL (ref 0–149)
VLDL Cholesterol Cal: 20 mg/dL (ref 5–40)

## 2018-11-23 LAB — T4, FREE: Free T4: 1.21 ng/dL (ref 0.82–1.77)

## 2018-11-23 LAB — TSH: TSH: 1.19 u[IU]/mL (ref 0.450–4.500)

## 2018-11-23 LAB — VITAMIN D 25 HYDROXY (VIT D DEFICIENCY, FRACTURES): Vit D, 25-Hydroxy: 30.6 ng/mL (ref 30.0–100.0)

## 2018-11-23 MED ORDER — ATORVASTATIN CALCIUM 10 MG PO TABS: 10.0000 mg | ORAL_TABLET | Freq: Every day | ORAL | 1 refills | Status: DC

## 2018-11-28 ENCOUNTER — Other Ambulatory Visit: Payer: Self-pay | Admitting: Family Medicine

## 2018-11-28 DIAGNOSIS — Z1231 Encounter for screening mammogram for malignant neoplasm of breast: Secondary | ICD-10-CM

## 2018-12-11 ENCOUNTER — Other Ambulatory Visit: Payer: Self-pay | Admitting: Internal Medicine

## 2018-12-11 DIAGNOSIS — E782 Mixed hyperlipidemia: Secondary | ICD-10-CM

## 2018-12-17 ENCOUNTER — Other Ambulatory Visit: Payer: Self-pay | Admitting: Family Medicine

## 2019-01-03 ENCOUNTER — Other Ambulatory Visit: Payer: Medicare Other

## 2019-01-07 ENCOUNTER — Other Ambulatory Visit: Payer: Medicare Other

## 2019-01-07 ENCOUNTER — Other Ambulatory Visit: Payer: Self-pay

## 2019-01-07 DIAGNOSIS — E782 Mixed hyperlipidemia: Secondary | ICD-10-CM

## 2019-01-08 LAB — LIPID PANEL
Chol/HDL Ratio: 2.4 ratio (ref 0.0–4.4)
Cholesterol, Total: 152 mg/dL (ref 100–199)
HDL: 64 mg/dL (ref >39)
LDL Chol Calc (NIH): 70 mg/dL (ref 0–99)
Triglycerides: 99 mg/dL (ref 0–149)
VLDL Cholesterol Cal: 18 mg/dL (ref 5–40)

## 2019-01-22 ENCOUNTER — Other Ambulatory Visit: Payer: Self-pay

## 2019-01-22 ENCOUNTER — Ambulatory Visit
Admission: RE | Admit: 2019-01-22 | Discharge: 2019-01-22 | Disposition: A | Discharge status: Home / Self Care | Payer: Medicare PPO | Source: Ambulatory Visit | Attending: Family Medicine | Admitting: Family Medicine

## 2019-01-22 DIAGNOSIS — Z1231 Encounter for screening mammogram for malignant neoplasm of breast: Secondary | ICD-10-CM

## 2019-02-20 ENCOUNTER — Other Ambulatory Visit: Payer: Self-pay | Admitting: Family Medicine

## 2019-03-18 IMAGING — MR MR ABDOMEN WO/W CM
13 of 17 series · 32 of 48 positions shown · IV contrast (multihance)
Comparison: 11/02/2015

CLINICAL DATA: Follow-up of liver lesion. Severe hepatic steatosis.
Risk of hepatocellular carcinoma.

EXAM:
MRI ABDOMEN WITHOUT AND WITH CONTRAST
TECHNIQUE: Multiplanar multisequence MR imaging of the abdomen was performed
both before and after the administration of intravenous contrast.
CONTRAST:  14mL MULTIHANCE GADOBENATE DIMEGLUMINE 529 MG/ML IV SOLN

[Series 3: T2 · coronal · 5.0mm · 1.56mm/px · 1 of 30 slices shown (1 of 3)]
[im 1/30]
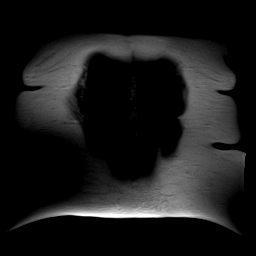

[Series 4: axial tru fisp · axial · 5.0mm · 1.56mm/px · 1 of 41 slices shown]
[im 1/41]
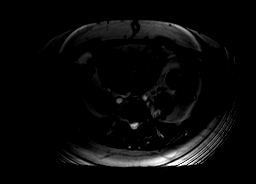

[Series 5: T2 · axial · 6.5mm · 0.78mm/px · 1 of 34 slices shown (2 of 3)]
[im 1/34]
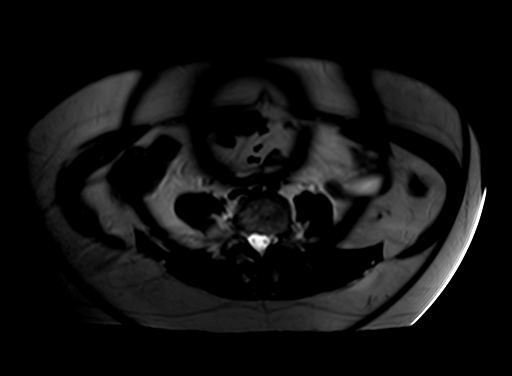

[Series 6: ep2d_[id number]_500_800_p2 · axial · 6.0mm · 2.08mm/px · z∈[-73,+177]mm · 3 of 99 slices shown]
[im 1/99]
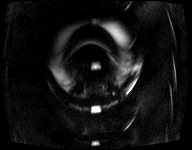
[im 50/99]
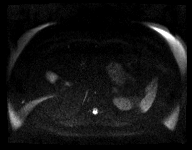
[im 99/99]
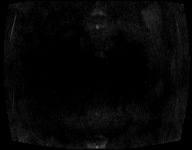

[Series 7: [id number]_adc · axial · 6.0mm · 2.08mm/px · 1 of 33 slices shown]
[im 1/33]
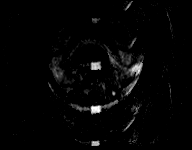

[Series 8: T2 · axial · 5.0mm · 1.56mm/px · 1 of 35 slices shown (3 of 3)]
[im 1/35]
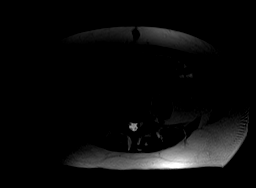

[Series 9: axial in out · axial · 6.0mm · 0.78mm/px · z∈[-124,+111]mm · 2 of 70 slices shown]
[im 1/70]
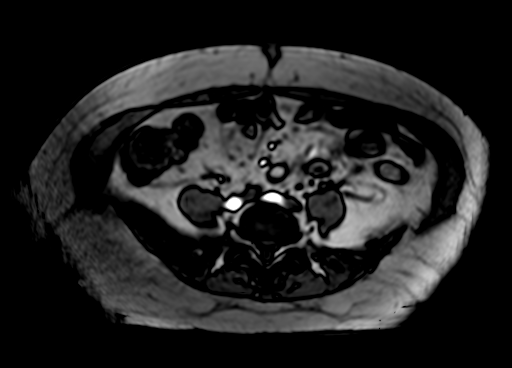
[im 70/70]
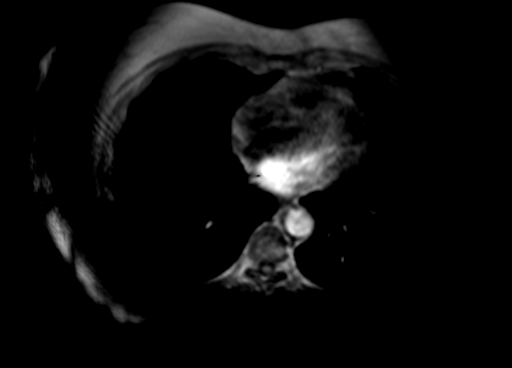

[Series 10: T1 dynamic · axial · non-contrast · 2.2mm · 0.78mm/px · z∈[-120,+107]mm · 4 of 104 slices shown]
[im 1/104]
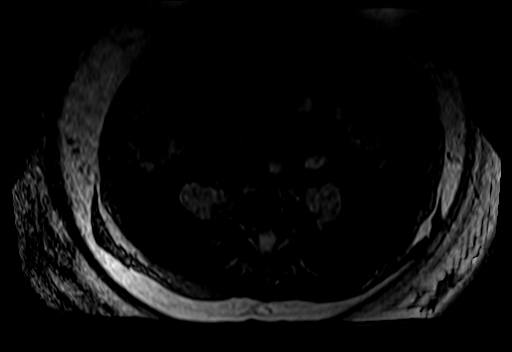
[im 35/104]
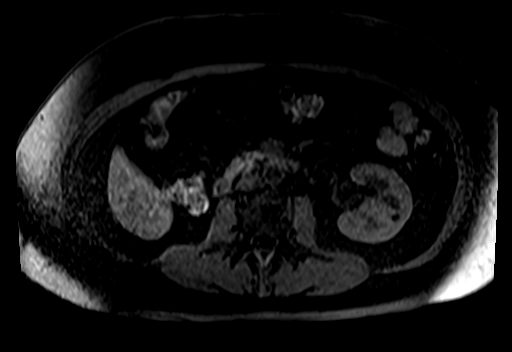
[im 69/104]
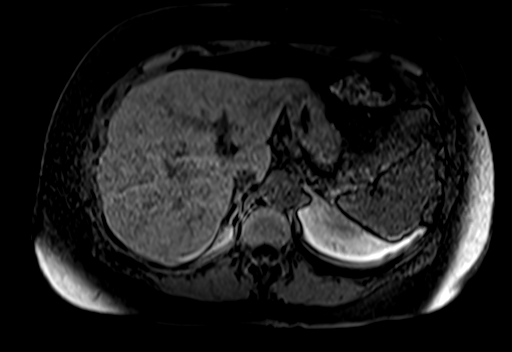
[im 104/104]
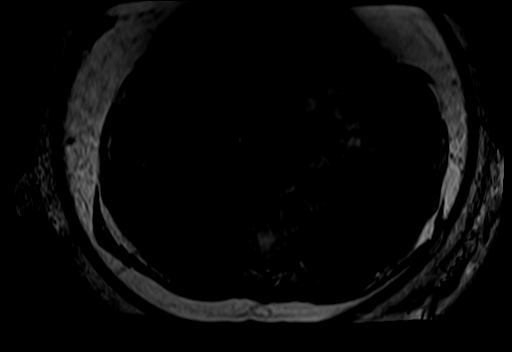

[Series 11: post 25 sec · axial · 2.2mm · 0.78mm/px · z∈[-120,+107]mm · 4 of 104 slices shown]
[im 1/104]
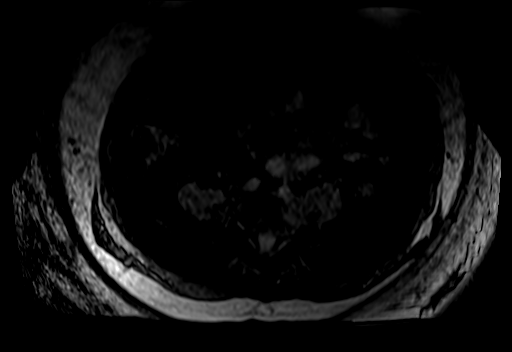
[im 35/104]
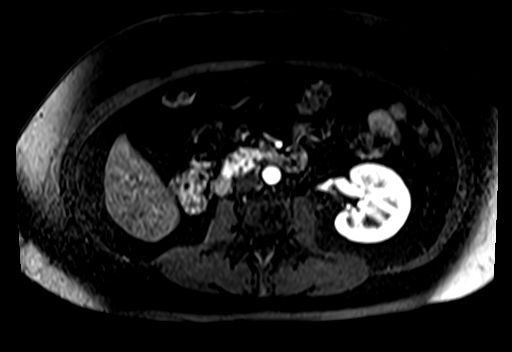
[im 69/104]
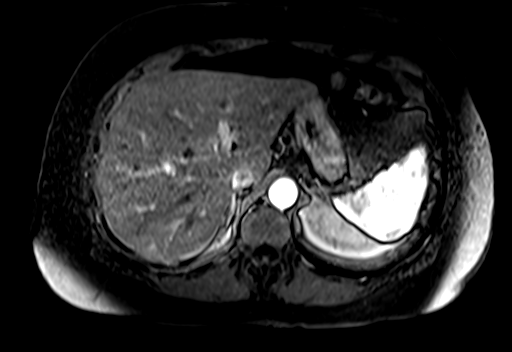
[im 104/104]
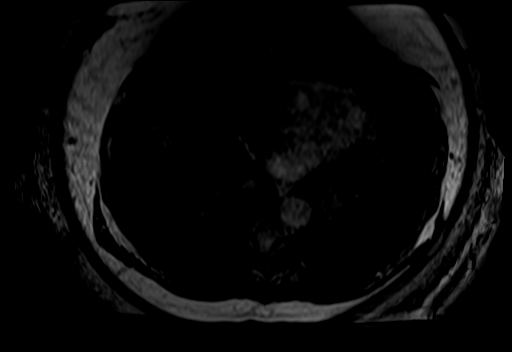

[Series 12: post 25 sec_sub · axial · 2.2mm · 0.78mm/px · z∈[-120,+107]mm · 4 of 104 slices shown]
[im 1/104]
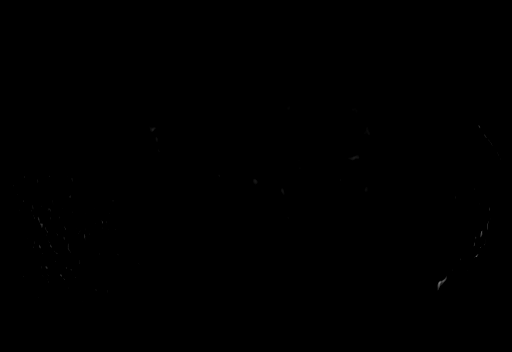
[im 35/104]
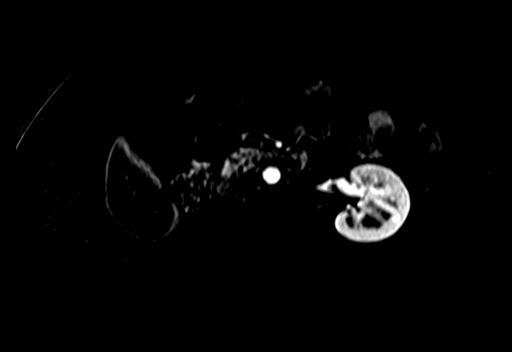
[im 69/104]
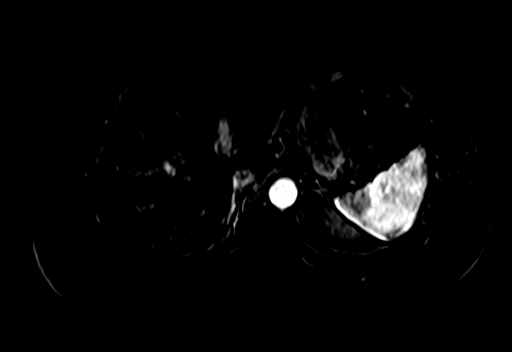
[im 104/104]
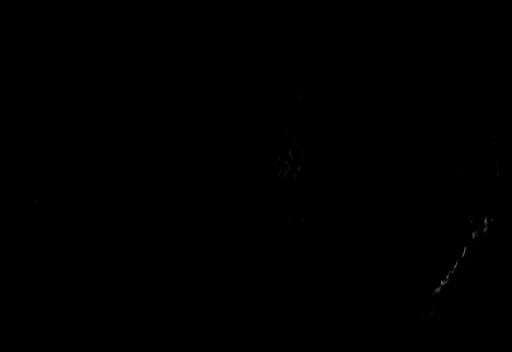

[Series 13: post 45 sec · axial · 2.2mm · 0.78mm/px · z∈[-120,+107]mm · 4 of 104 slices shown]
[im 1/104]
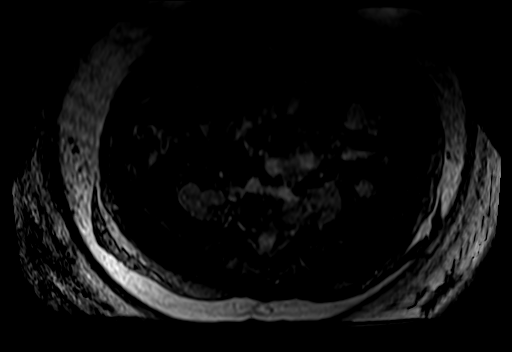
[im 35/104]
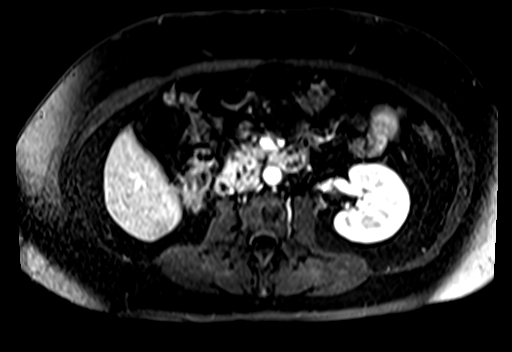
[im 69/104]
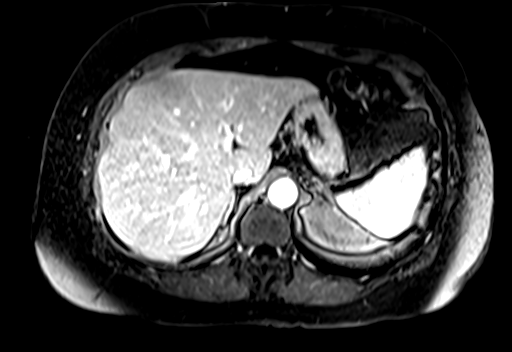
[im 104/104]
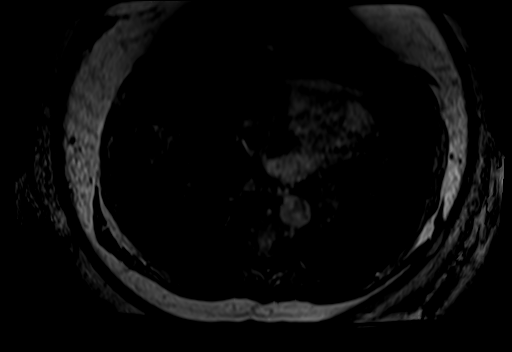

[Series 14: post 45 sec_sub · axial · 2.2mm · 0.78mm/px · z∈[-120,+107]mm · 4 of 104 slices shown]
[im 1/104]
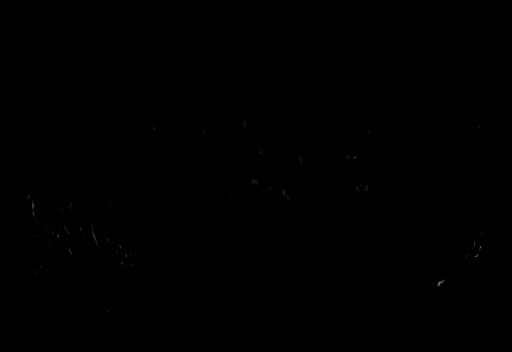
[im 35/104]
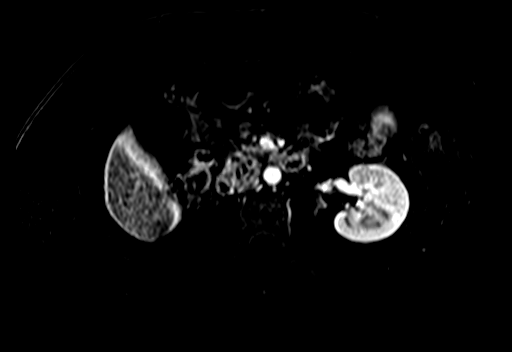
[im 69/104]
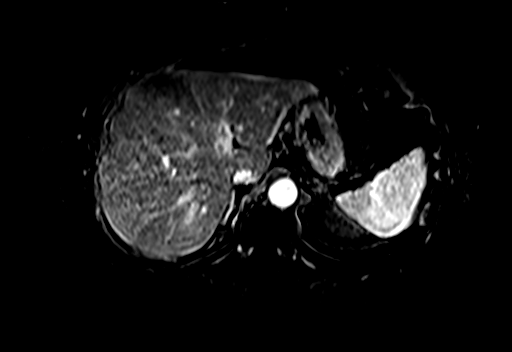
[im 104/104]
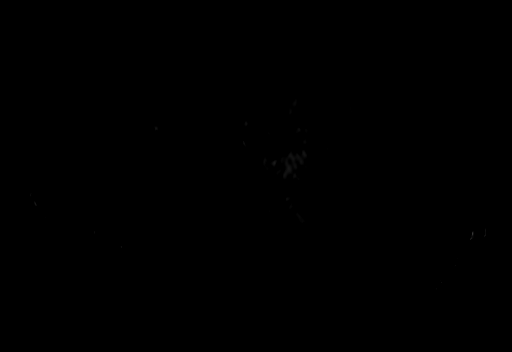

[Series 15: post 90 sec · axial · 2.2mm · 0.78mm/px · z∈[-120,-45]mm · 2 of 104 slices shown]
[im 1/104]
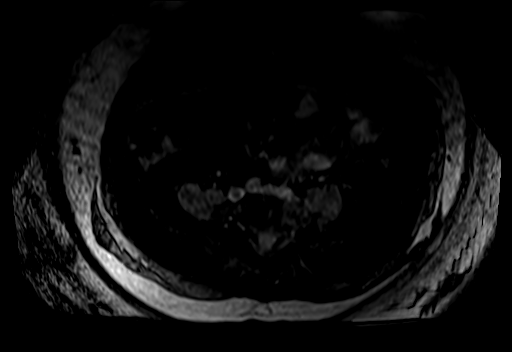
[im 35/104]
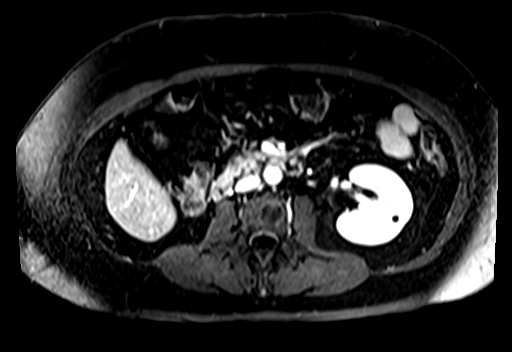

[32 of 48 positions shown; findings below may reference images not displayed]

FINDINGS: Lower chest: Normal heart size without pericardial or pleural
effusion.

Hepatobiliary: No cirrhosis. Moderate hepatic steatosis. Similar
appearance of segment 2 area of atrophy and intrahepatic duct
dilatation.

Subtle area of hypoenhancement within the right liver lobe,
posterior to the IVC. Less distinct today, on the order of 9 x 6 mm
on image 35/13. Compare 10 x 6 mm on the prior.

No new liver lesion. Gallstones at up to 1.7 cm. No biliary duct
dilatation or acute cholecystitis.

Pancreas:  Normal, without mass or ductal dilatation.

Spleen:  Normal in size, without focal abnormality.

Adrenals/Urinary Tract: Normal adrenal glands. Right kidney absent.
Tiny left renal cysts.

Stomach/Bowel: Tiny hiatal hernia.  Normal abdominal bowel loops.

Vascular/Lymphatic: Aortic atherosclerosis. No abdominal adenopathy.

Other:  No ascites.

Musculoskeletal: Tarlov cysts.
IMPRESSION: 1. Improvement to near complete resolution of hypoenhancing lesion
posterior to the IVC. This had imaging characteristics consistent
with an area of focal nodular hyperplasia on remote MRIs.
2. Hepatic steatosis, without suspicious liver lesion.
3.  Aortic Atherosclerosis ([id number]).
4. Cholelithiasis.

## 2019-04-30 DIAGNOSIS — I10 Essential (primary) hypertension: Secondary | ICD-10-CM | POA: Diagnosis not present

## 2019-04-30 DIAGNOSIS — K219 Gastro-esophageal reflux disease without esophagitis: Secondary | ICD-10-CM | POA: Diagnosis not present

## 2019-04-30 DIAGNOSIS — E785 Hyperlipidemia, unspecified: Secondary | ICD-10-CM | POA: Diagnosis not present

## 2019-04-30 DIAGNOSIS — Z7982 Long term (current) use of aspirin: Secondary | ICD-10-CM | POA: Diagnosis not present

## 2019-05-21 NOTE — Progress Notes (Signed)
   Subjective:    Patient ID: Alexandra Davies, female    DOB: Feb 15, 1950, 69 y.o.   MRN: [id number]  HPI Chief Complaint  Patient presents with  . med check    6 month med check   She is here for a 6 month medication management visit.  No new concerns today.   HTN- taking Maxide 25 mg. No side effects. Does not check BP at home.   HL- states she takes her statin daily   Taking an 81 mg ASA.   Prediabetes- Hgb A1c 5.9% 11/22/2019 Feels that her diet is fairly healthy.  No regular exercise. She does walk when she is shopping.   Osteopenia- T-score -2.1 in 12/2017. Getting calcium in her diet. Taking vitamin D.   Vitamin D def- taking a multi- vitamin   Fatty liver disease- she has alcohol some evenings. Drinks 2-3 glasses of wine on the weekend.   GERD- states she takes omeprazole every morning. No breakthrough reflux.   Anxiety and depression- states her mood is good. She and her husband are enjoying life together.   Denies fever, chills, dizziness, chest pain, palpitations, shortness of breath, abdominal pain, N/V/D, urinary symptoms, LE edema.   Reviewed allergies, medications, past medical, surgical, family, and social history.    Review of Systems Pertinent positives and negatives in the history of present illness.     Objective:   Physical Exam BP 130/70   Pulse 73   Temp (!) 97.3 F (36.3 C)   Ht 5' 1.25" (1.556 m)   Wt 156 lb 9.6 oz (71 kg)   SpO2 97%   BMI 29.35 kg/m   Alert and oriented in no acute distress.  Respirations unlabored.  Normal speech, mood and thought process.  Not otherwise examined.      Assessment & Plan:  Essential hypertension -Blood pressure is in goal range.  Recommend that she continue on her current medication.  Mixed hyperlipidemia -Recent lipid panel showed significant improvement in LDL.  Recommend she continue on statin therapy  Prediabetes -Hgb A1c 5.9% on 11/22/2018   Osteopenia determined by x-ray -she will be  due for a repeat DEXA in 12/2019. Continue with calcium, vitamin D and get more weight bearing exercises.   Vitamin D deficiency -continue taking supplement.   Hepatic steatosis -she is aware of fatty liver disease. Discussed cutting back to no more than one alcoholic drink daily or less.   Medication management -discussed reducing PPI use to minimum necessary for GERD. Continue on other medications. Refill as needed.  Follow up in November when due for AWV

## 2019-05-22 ENCOUNTER — Encounter: Payer: Self-pay | Admitting: Family Medicine

## 2019-05-22 ENCOUNTER — Ambulatory Visit: Payer: Medicare PPO | Admitting: Family Medicine

## 2019-05-22 ENCOUNTER — Other Ambulatory Visit: Payer: Self-pay

## 2019-05-22 VITALS — BP 130/70 | HR 73 | Temp 97.3°F | Ht 61.25 in | Wt 156.6 lb

## 2019-05-22 DIAGNOSIS — E782 Mixed hyperlipidemia: Secondary | ICD-10-CM

## 2019-05-22 DIAGNOSIS — K760 Fatty (change of) liver, not elsewhere classified: Secondary | ICD-10-CM

## 2019-05-22 DIAGNOSIS — M8580 Other specified disorders of bone density and structure, unspecified site: Secondary | ICD-10-CM

## 2019-05-22 DIAGNOSIS — E559 Vitamin D deficiency, unspecified: Secondary | ICD-10-CM | POA: Diagnosis not present

## 2019-05-22 DIAGNOSIS — I10 Essential (primary) hypertension: Secondary | ICD-10-CM

## 2019-05-22 DIAGNOSIS — Z79899 Other long term (current) drug therapy: Secondary | ICD-10-CM | POA: Diagnosis not present

## 2019-05-22 DIAGNOSIS — R7303 Prediabetes: Secondary | ICD-10-CM | POA: Diagnosis not present

## 2019-06-13 ENCOUNTER — Other Ambulatory Visit: Payer: Self-pay | Admitting: Family Medicine

## 2019-09-19 DIAGNOSIS — S80222A Blister (nonthermal), left knee, initial encounter: Secondary | ICD-10-CM | POA: Diagnosis not present

## 2019-09-30 ENCOUNTER — Other Ambulatory Visit: Payer: Self-pay | Admitting: Family Medicine

## 2019-11-27 NOTE — Progress Notes (Signed)
Alexandra Davies is a 69 y.o. female who presents for annual wellness visit, CPE and follow-up on chronic medical conditions.  She has the following concerns:  States she has a red spot on her vulva that was found by her aesthetician last week. Asymptomatic   Right hip pain since July 2021. pain is improving since stepping in a hole.   HTN- reports doing well on medication. No issues.   HL and aortic atherosclerosis- taking statin daily   Prediabetes- needs follow up  History of elevated liver enzymes, fatty liver and benign liver lesion. She drinks 1-2 glasses of wine on occasion but not regularly.   Osteopenia- last DEXA 2019 and due for f/u soon   Vitamin D def- Taking MVI   GERD- omeprazole daily   Her mood is good.    Immunization History  Administered Date(s) Administered   Fluad Quad(high Dose 65+) 11/28/2019   Influenza, High Dose Seasonal PF 09/22/2017, 11/05/2018   Influenza,inj,Quad PF,6+ Mos 09/17/2014, 10/19/2015   Influenza-Unspecified 11/19/2012, 10/30/2016   Pneumococcal Conjugate-13 11/15/2016, 11/20/2017   Tdap 01/16/2009, 11/22/2018   Zoster 10/21/2015   Last Pap smear: 2017 Last mammogram: January 2021 Last colonoscopy:  2016 and due for recall in 2022  Last DEXA: 2019 Dentist:  Every 6 months  Ophtho: every 2 years Exercise: walking  No alcohol in 4 weeks   Other doctors caring for patient [facility]: [facility]  Dr. Paulita Fujita - GI  Allergist     Depression screen:  See questionnaire below.  Depression screen [facility] 2/9 11/28/2019 05/22/2019 11/22/2018 11/20/2017 11/15/2016  Decreased Interest 0 0 0 0 0  Down, Depressed, Hopeless 0 0 0 0 0  PHQ - 2 Score 0 0 0 0 0    Fall Risk Screen: see questionnaire below. Fall Risk  11/28/2019 05/22/2019 11/22/2018 11/20/2017 11/15/2016  Falls in the past year? 0 0 0 0 No  Number falls in past yr: 0 0 0 - -  Injury with Fall? 0 - 0 - -  Risk for fall due to : - - -  - -    ADL screen:  See questionnaire below Functional Status Survey: Is the patient deaf or have difficulty hearing?: Yes Does the patient have difficulty seeing, even when wearing glasses/contacts?: No Does the patient have difficulty concentrating, remembering, or making decisions?: No Does the patient have difficulty walking or climbing stairs?: No Does the patient have difficulty dressing or bathing?: No Does the patient have difficulty doing errands alone such as visiting a doctor's office or shopping?: No   End of Life Discussion:  Patient does not have a living will and medical power of attorney. Paperwork provided. MOST form reviewed and signed  Review of Systems Constitutional: -fever, -chills, -sweats, -unexpected weight change, -anorexia, -fatigue Allergy: -sneezing, -itching, +intermittent congestion Dermatology: denies changing moles, rash, or lumps. Lesion on vulva  ENT: -runny nose, -ear pain, -sore throat, -hoarseness, -sinus pain, -teeth pain, -tinnitus, + right mild hearing loss, -epistaxis Cardiology:  -chest pain, -palpitations, -edema, -orthopnea, -paroxysmal nocturnal dyspnea Respiratory: -cough, -shortness of breath, -dyspnea on exertion, -wheezing, -hemoptysis Gastroenterology: -abdominal pain, -nausea, -vomiting, -diarrhea, -constipation, -blood in stool, -changes in bowel movement, -dysphagia Hematology: -bleeding or bruising problems Musculoskeletal: -arthralgias, -myalgias, -joint swelling, -back pain, -neck pain, -cramping, -gait changes Ophthalmology: -vision changes, -eye redness, -itching, -discharge Urology: -dysuria, -difficulty urinating, -hematuria, -urinary frequency, -urgency, -incontinence Neurology: -headache, -weakness, -tingling, -numbness, -speech abnormality, -memory loss, -falls, -dizziness Psychology:  -depressed mood, -agitation, -sleep problems  PHYSICAL EXAM:  BP 122/80    Pulse 78    Ht 5' 1.25" (1.556 m)    Wt 160 lb 6.4 oz (72.8  kg)    BMI 30.06 kg/m   General Appearance: Alert, cooperative, no distress, appears stated age Head: Normocephalic, without obvious abnormality, atraumatic Eyes: PERRL, conjunctiva/corneas clear, EOM's intact Ears: bilateral cerumen impaction with normal external ear canals Nose: mask on  Throat: mask on Neck: Supple, no lymphadenopathy; thyroid: no enlargement/tenderness/nodules; no JVD Back: Spine nontender, no curvature, ROM normal, no CVA tenderness Lungs: Clear to auscultation bilaterally without wheezes, rales or ronchi; respirations unlabored Chest Wall: No tenderness or deformity Heart: Regular rate and rhythm, S1 and S2 normal, no murmur, rub or gallop Breast Exam: declines. Mammogram UTD Abdomen: Soft, non-tender, nondistended, normoactive bowel sounds, no masses, no hepatosplenomegaly Genitalia: ulcerated and erythematous lesion on her right labia majora, no drainage, non tender. No abnormal vaginal discharge.  Extremities: No clubbing, cyanosis or edema Pulses: 2+ and symmetric all extremities Skin: Skin color, texture, turgor normal, no rashes  Lymph nodes: Cervical, supraclavicular, and axillary nodes normal Neurologic: CNII-XII intact, normal strength, sensation and gait; reflexes 2+ and symmetric throughout Psych: Normal mood, affect, hygiene and grooming.  ASSESSMENT/PLAN: Medicare annual wellness visit, subsequent - Plan: POCT Urinalysis DIP (Proadvantage Device) -She is here today for Medicare wellness visit.  Denies falls, memory concerns, depression or difficulty with ADLs.  Medications reviewed.  Advanced directives reviewed and paperwork provided once again this year.  MOST form was reviewed and signed.  Routine general medical examination at a health care facility - Plan: CBC with Differential/Platelet, Comprehensive metabolic panel, TSH, T4, free -Preventive health care discussed.  She will call and schedule her mammogram and bone density at the same time in  January.  Discussed that she appears to be overdue for colonoscopy and I recommend she call [facility] to schedule a visit.  Recommend regular dental and eye exams.  Counseling on healthy lifestyle including diet and exercise and limiting alcohol use.  Immunizations reviewed.  Discussed safety.  Essential hypertension - Plan: CBC with Differential/Platelet, Comprehensive metabolic panel -Blood pressure well controlled.  Continue current medication.  Continue low-sodium diet and walking for exercise.  Aortic atherosclerosis ([place]) - Plan: Lipid panel -Continue statin therapy and low-fat diet.  Continue walking for exercise.  Mixed hyperlipidemia - Plan: Lipid panel -Continue statin therapy.  Check lipid panel and follow-up  Prediabetes - Plan: Hemoglobin A1c -Follow-up pending A1c results  Elevated LFTs - Plan: Comprehensive metabolic panel -Recommend low-fat diet and avoiding alcohol  Hepatic steatosis - Plan: Comprehensive metabolic panel -Recommend low-fat diet and avoiding alcohol.  Asymptomatic  Liver tumor (benign) -Reviewed last MRI from December 2019 showing improvement to near complete resolution of lesion posterior to the IVC.  She also had hepatic steatosis without suspicious liver lesion.  Osteopenia determined by x-ray - Plan: DG Bone Density -Discussed getting adequate calcium in her diet, taking vitamin D supplement and weightbearing exercise.  She will call and schedule her bone density  Estrogen deficiency - Plan: DG Bone Density  Vitamin D deficiency - Plan: VITAMIN D 25 Hydroxy (Vit-D Deficiency, Fractures) -Currently taking vitamin D in her multivitamin only.  Check vitamin D level and adjust dose as needed  Needs flu shot - Plan: Flu Vaccine QUAD High Dose(Fluad)  Vulvar lesion - Plan: Ambulatory referral to Gynecology -Does not appear herpetic.  Referral to gynecology for further evaluation and possible biopsy.  Screening for thyroid disorder - Plan: TSH,  T4,  free -Follow-up pending results     Discussed monthly self breast exams and yearly mammograms; at least 30 minutes of aerobic activity at least 5 days/week and weight-bearing exercise 2x/week; proper sunscreen use reviewed; healthy diet, including goals of calcium and vitamin D intake and alcohol recommendations (less than or equal to 1 drink/day) reviewed; regular seatbelt use; changing batteries in smoke detectors.  Immunization recommendations discussed.  Colonoscopy recommendations reviewed   Medicare Attestation I have personally reviewed: The patient's medical and social history Their use of alcohol, tobacco or illicit drugs Their current medications and supplements The patient's functional ability including ADLs,fall risks, home safety risks, cognitive, and hearing and visual impairment Diet and physical activities Evidence for depression or mood disorders  The patient's weight, height, and BMI have been recorded in the chart.  I have made referrals, counseling, and provided education to the patient based on review of the above and I have provided the patient with a written personalized care plan for preventive services.     Harland Dingwall, NP-C   11/28/2019

## 2019-11-28 ENCOUNTER — Ambulatory Visit: Payer: Medicare PPO | Admitting: Family Medicine

## 2019-11-28 ENCOUNTER — Other Ambulatory Visit: Payer: Self-pay

## 2019-11-28 ENCOUNTER — Encounter: Payer: Self-pay | Admitting: Family Medicine

## 2019-11-28 VITALS — BP 122/80 | HR 78 | Ht 61.25 in | Wt 160.4 lb

## 2019-11-28 DIAGNOSIS — D134 Benign neoplasm of liver: Secondary | ICD-10-CM

## 2019-11-28 DIAGNOSIS — K760 Fatty (change of) liver, not elsewhere classified: Secondary | ICD-10-CM

## 2019-11-28 DIAGNOSIS — M8580 Other specified disorders of bone density and structure, unspecified site: Secondary | ICD-10-CM

## 2019-11-28 DIAGNOSIS — E559 Vitamin D deficiency, unspecified: Secondary | ICD-10-CM

## 2019-11-28 DIAGNOSIS — I10 Essential (primary) hypertension: Secondary | ICD-10-CM

## 2019-11-28 DIAGNOSIS — E2839 Other primary ovarian failure: Secondary | ICD-10-CM | POA: Diagnosis not present

## 2019-11-28 DIAGNOSIS — R7989 Other specified abnormal findings of blood chemistry: Secondary | ICD-10-CM | POA: Diagnosis not present

## 2019-11-28 DIAGNOSIS — Z0000 Encounter for general adult medical examination without abnormal findings: Secondary | ICD-10-CM

## 2019-11-28 DIAGNOSIS — Z23 Encounter for immunization: Secondary | ICD-10-CM | POA: Diagnosis not present

## 2019-11-28 DIAGNOSIS — N9089 Other specified noninflammatory disorders of vulva and perineum: Secondary | ICD-10-CM

## 2019-11-28 DIAGNOSIS — R7303 Prediabetes: Secondary | ICD-10-CM | POA: Diagnosis not present

## 2019-11-28 DIAGNOSIS — E782 Mixed hyperlipidemia: Secondary | ICD-10-CM | POA: Diagnosis not present

## 2019-11-28 DIAGNOSIS — Z1329 Encounter for screening for other suspected endocrine disorder: Secondary | ICD-10-CM

## 2019-11-28 DIAGNOSIS — I700 Atherosclerosis of aorta: Secondary | ICD-10-CM | POA: Diagnosis not present

## 2019-11-28 LAB — POCT URINALYSIS DIP (PROADVANTAGE DEVICE)
Bilirubin, UA: NEGATIVE
Blood, UA: NEGATIVE
Glucose, UA: NEGATIVE mg/dL
Ketones, POC UA: NEGATIVE mg/dL
Leukocytes, UA: NEGATIVE
Nitrite, UA: NEGATIVE
Protein Ur, POC: NEGATIVE mg/dL
Specific Gravity, Urine: 1.005
Urobilinogen, Ur: NEGATIVE
pH, UA: 7 (ref 5.0–8.0)

## 2019-11-28 NOTE — Patient Instructions (Signed)
  Ms. Alexandra Davies , Thank you for taking time to come for your Medicare Wellness Visit. I appreciate your ongoing commitment to your health goals. Please review the following plan we discussed and let me know if I can assist you in the future.   These are the goals we discussed:  You will hear from [facility].   You appear to be overdue for your colonoscopy. Please call Dr. Erlinda Hong office to check on this.      This is a list of the screening recommended for you and due dates:  Health Maintenance  Topic Date Due  . COVID-19 Vaccine (1) Never done  . Pneumonia vaccines (2 of 2 - PPSV23) 11/21/2018  . Colon Cancer Screening  07/01/2019  . Mammogram  01/21/2021  . Tetanus Vaccine  11/21/2028  . Flu Shot  Completed  . DEXA scan (bone density measurement)  Completed  .  Hepatitis C: One time screening is recommended by Center for Disease Control  (CDC) for  adults born from [birth year] through 1964.   Completed

## 2019-11-29 LAB — COMPREHENSIVE METABOLIC PANEL
ALT: 25 IU/L (ref 0–32)
AST: 21 IU/L (ref 0–40)
Albumin/Globulin Ratio: 1.8 (ref 1.2–2.2)
Albumin: 4.8 g/dL (ref 3.8–4.8)
Alkaline Phosphatase: 98 IU/L (ref 44–121)
BUN/Creatinine Ratio: 12 (ref 12–28)
BUN: 9 mg/dL (ref 8–27)
Bilirubin Total: 0.5 mg/dL (ref 0.0–1.2)
CO2: 26 mmol/L (ref 20–29)
Calcium: 9.6 mg/dL (ref 8.7–10.3)
Chloride: 98 mmol/L (ref 96–106)
Creatinine, Ser: 0.75 mg/dL (ref 0.57–1.00)
GFR calc Af Amer: 95 mL/min/{1.73_m2} (ref >59)
GFR calc non Af Amer: 82 mL/min/{1.73_m2} (ref >59)
Globulin, Total: 2.6 g/dL (ref 1.5–4.5)
Glucose: 106 mg/dL — ABNORMAL HIGH (ref 65–99)
Potassium: 4.1 mmol/L (ref 3.5–5.2)
Sodium: 138 mmol/L (ref 134–144)
Total Protein: 7.4 g/dL (ref 6.0–8.5)

## 2019-11-29 LAB — LIPID PANEL
Chol/HDL Ratio: 2.8 ratio (ref 0.0–4.4)
Cholesterol, Total: 163 mg/dL (ref 100–199)
HDL: 59 mg/dL (ref >39)
LDL Chol Calc (NIH): 83 mg/dL (ref 0–99)
Triglycerides: 120 mg/dL (ref 0–149)
VLDL Cholesterol Cal: 21 mg/dL (ref 5–40)

## 2019-11-29 LAB — CBC WITH DIFFERENTIAL/PLATELET
Basophils Absolute: 0.1 10*3/uL (ref 0.0–0.2)
Basos: 1 %
EOS (ABSOLUTE): 0.2 10*3/uL (ref 0.0–0.4)
Eos: 2 %
Hematocrit: 46.3 % (ref 34.0–46.6)
Hemoglobin: 15.4 g/dL (ref 11.1–15.9)
Immature Grans (Abs): 0 10*3/uL (ref 0.0–0.1)
Immature Granulocytes: 0 %
Lymphocytes Absolute: 2.4 10*3/uL (ref 0.7–3.1)
Lymphs: 26 %
MCH: 30.7 pg (ref 26.6–33.0)
MCHC: 33.3 g/dL (ref 31.5–35.7)
MCV: 92 fL (ref 79–97)
Monocytes Absolute: 0.6 10*3/uL (ref 0.1–0.9)
Monocytes: 6 %
Neutrophils Absolute: 6 10*3/uL (ref 1.4–7.0)
Neutrophils: 65 %
Platelets: 293 10*3/uL (ref 150–450)
RBC: 5.02 x10E6/uL (ref 3.77–5.28)
RDW: 11.8 % (ref 11.7–15.4)
WBC: 9.4 10*3/uL (ref 3.4–10.8)

## 2019-11-29 LAB — TSH: TSH: 1.16 u[IU]/mL (ref 0.450–4.500)

## 2019-11-29 LAB — HEMOGLOBIN A1C
Est. average glucose Bld gHb Est-mCnc: 126 mg/dL
Hgb A1c MFr Bld: 6 % — ABNORMAL HIGH (ref 4.8–5.6)

## 2019-11-29 LAB — VITAMIN D 25 HYDROXY (VIT D DEFICIENCY, FRACTURES): Vit D, 25-Hydroxy: 32.5 ng/mL (ref 30.0–100.0)

## 2019-11-29 LAB — T4, FREE: Free T4: 1.15 ng/dL (ref 0.82–1.77)

## 2019-12-02 ENCOUNTER — Other Ambulatory Visit: Payer: Self-pay

## 2019-12-02 ENCOUNTER — Ambulatory Visit (INDEPENDENT_AMBULATORY_CARE_PROVIDER_SITE_OTHER): Payer: Medicare PPO | Admitting: Family Medicine

## 2019-12-02 DIAGNOSIS — H6123 Impacted cerumen, bilateral: Secondary | ICD-10-CM

## 2019-12-02 NOTE — Progress Notes (Signed)
° °  Subjective:    Patient ID: Alexandra Davies, female    DOB: February 19, 1950, 69 y.o.   MRN: [id number]  HPI No chief complaint on file.  She is here with complaints of bilateral cerumen impaction from her CPE last week. We were planning to do a bilateral lavage but did not. She is back to have this done. No new symptoms.   Review of Systems Pertinent positives and negatives in the history of present illness.     Objective:   Physical Exam There were no vitals taken for this visit.  Cerumen impacted ear canals pre lavage       Assessment & Plan:  Bilateral impacted cerumen  CMA Sabrina disimpacted cerumen bilaterally and she reports significant improvement. Normal TMs post lavage.

## 2019-12-04 ENCOUNTER — Other Ambulatory Visit: Payer: Self-pay

## 2019-12-04 ENCOUNTER — Encounter: Payer: Self-pay | Admitting: Obstetrics & Gynecology

## 2019-12-04 ENCOUNTER — Ambulatory Visit: Payer: Medicare PPO | Admitting: Obstetrics & Gynecology

## 2019-12-04 VITALS — BP 132/84 | Ht 60.5 in | Wt 157.0 lb

## 2019-12-04 DIAGNOSIS — N763 Subacute and chronic vulvitis: Secondary | ICD-10-CM

## 2019-12-04 NOTE — Progress Notes (Signed)
    Alexandra Davies 1950-01-19 [id number]        69 y.o.  G1P0010   RP: Redness at vulva  HPI: Patient went for [place] waxing 2 weeks ago and was told that a red area was present at her posterior vulva.  Annual exam done with family NP last week who confirmed the red area at the posterior vulva and is sending patient for evaluation here today.  Patient has no feeling of irritation, pain, burning or itching.  No bleeding.  Patient has not been sexually active for about 10 to 15 years.  Had not noticed any problems with her vulva.   OB History  Gravida Para Term Preterm AB Living  1 0     1 0  SAB TAB Ectopic Multiple Live Births               # Outcome Date GA Lbr Len/2nd Weight Sex Delivery Anes PTL Lv  1 AB             Past medical history,surgical history, problem list, medications, allergies, family history and social history were all reviewed and documented in the EPIC chart.   Directed ROS with pertinent positives and negatives documented in the history of present illness/assessment and plan.  Exam:  Vitals:   12/04/19 1426  BP: 132/84  Weight: 157 lb (71.2 kg)  Height: 5' 0.5" (1.537 m)   General appearance:  Normal  Gynecologic exam: Vulva: Normal vulva in postmenopausal patient.  Small area of erythema and skin change at the posterior vulva secondary to chronic rubbing between the most anterior part of the butt cheeks at the post vulva.   Assessment/Plan:  69 y.o. G1P0010   1. Chronic vulvitis Chronic rubbing of the skin between the most anterior part of the butt cheeks at the posterior vulva.  Bilateral chronic inflammation of the skin at that location.  No evidence of any acute inflammation.  Recommend putting a little bit of Vaseline to decrease the rubbing.  Given that the patient is asymptomatic and the inflammation is minimal, decision not to start on any corticosteroid cream at this time.  Counseling done about the issue and patient will call back for  reevaluation and possible treatment as needed.  Princess Bruins MD, 2:43 PM 12/04/2019

## 2019-12-16 ENCOUNTER — Other Ambulatory Visit: Payer: Self-pay | Admitting: Family Medicine

## 2020-01-08 ENCOUNTER — Other Ambulatory Visit: Payer: Self-pay | Admitting: Family Medicine

## 2020-01-08 DIAGNOSIS — Z1231 Encounter for screening mammogram for malignant neoplasm of breast: Secondary | ICD-10-CM

## 2020-01-23 ENCOUNTER — Other Ambulatory Visit: Payer: Self-pay | Admitting: Family Medicine

## 2020-02-17 ENCOUNTER — Other Ambulatory Visit: Payer: Self-pay

## 2020-02-17 ENCOUNTER — Ambulatory Visit
Admission: RE | Admit: 2020-02-17 | Discharge: 2020-02-17 | Disposition: A | Discharge status: Home / Self Care | Payer: Medicare PPO | Source: Ambulatory Visit | Attending: Family Medicine | Admitting: Family Medicine

## 2020-02-17 DIAGNOSIS — Z1231 Encounter for screening mammogram for malignant neoplasm of breast: Secondary | ICD-10-CM | POA: Diagnosis not present

## 2020-03-10 DIAGNOSIS — H524 Presbyopia: Secondary | ICD-10-CM | POA: Diagnosis not present

## 2020-04-18 ENCOUNTER — Other Ambulatory Visit: Payer: Self-pay | Admitting: Family Medicine

## 2020-04-24 ENCOUNTER — Other Ambulatory Visit: Payer: Medicare PPO

## 2020-05-08 DIAGNOSIS — R051 Acute cough: Secondary | ICD-10-CM | POA: Diagnosis not present

## 2020-05-08 DIAGNOSIS — J3089 Other allergic rhinitis: Secondary | ICD-10-CM | POA: Diagnosis not present

## 2020-05-11 ENCOUNTER — Telehealth: Payer: Medicare PPO | Admitting: Family Medicine

## 2020-05-11 ENCOUNTER — Other Ambulatory Visit: Payer: Self-pay

## 2020-05-21 DIAGNOSIS — L814 Other melanin hyperpigmentation: Secondary | ICD-10-CM | POA: Diagnosis not present

## 2020-05-21 DIAGNOSIS — L821 Other seborrheic keratosis: Secondary | ICD-10-CM | POA: Diagnosis not present

## 2020-05-21 DIAGNOSIS — D225 Melanocytic nevi of trunk: Secondary | ICD-10-CM | POA: Diagnosis not present

## 2020-05-21 DIAGNOSIS — L578 Other skin changes due to chronic exposure to nonionizing radiation: Secondary | ICD-10-CM | POA: Diagnosis not present

## 2020-05-21 DIAGNOSIS — L219 Seborrheic dermatitis, unspecified: Secondary | ICD-10-CM | POA: Diagnosis not present

## 2020-05-21 DIAGNOSIS — H019 Unspecified inflammation of eyelid: Secondary | ICD-10-CM | POA: Diagnosis not present

## 2020-06-13 ENCOUNTER — Other Ambulatory Visit: Payer: Self-pay | Admitting: Family Medicine

## 2020-07-26 ENCOUNTER — Other Ambulatory Visit: Payer: Self-pay | Admitting: Family Medicine

## 2020-09-11 ENCOUNTER — Other Ambulatory Visit: Payer: Self-pay

## 2020-09-11 MED ORDER — OMEPRAZOLE 20 MG PO CPDR
DELAYED_RELEASE_CAPSULE | ORAL | 2 refills | Status: DC
Start: 2020-09-11 — End: 2020-12-10

## 2020-09-11 MED ORDER — TRIAMTERENE-HCTZ 37.5-25 MG PO TABS
1.0000 | ORAL_TABLET | Freq: Every day | ORAL | 2 refills | Status: DC
Start: 2020-09-11 — End: 2020-12-10

## 2020-10-05 ENCOUNTER — Other Ambulatory Visit: Payer: Self-pay

## 2020-10-05 ENCOUNTER — Ambulatory Visit
Admission: RE | Admit: 2020-10-05 | Discharge: 2020-10-05 | Disposition: A | Discharge status: Home / Self Care | Payer: Medicare PPO | Source: Ambulatory Visit | Attending: Family Medicine | Admitting: Family Medicine

## 2020-10-05 DIAGNOSIS — M8589 Other specified disorders of bone density and structure, multiple sites: Secondary | ICD-10-CM | POA: Diagnosis not present

## 2020-10-05 DIAGNOSIS — M8580 Other specified disorders of bone density and structure, unspecified site: Secondary | ICD-10-CM

## 2020-10-05 DIAGNOSIS — E2839 Other primary ovarian failure: Secondary | ICD-10-CM

## 2020-10-05 DIAGNOSIS — Z780 Asymptomatic menopausal state: Secondary | ICD-10-CM | POA: Diagnosis not present

## 2020-11-30 ENCOUNTER — Ambulatory Visit: Payer: Medicare PPO | Admitting: Family Medicine

## 2020-12-10 ENCOUNTER — Other Ambulatory Visit: Payer: Self-pay

## 2020-12-10 MED ORDER — TRIAMTERENE-HCTZ 37.5-25 MG PO TABS: 1.0000 | ORAL_TABLET | Freq: Every day | ORAL | 2 refills | Status: AC

## 2020-12-10 MED ORDER — OMEPRAZOLE 20 MG PO CPDR: DELAYED_RELEASE_CAPSULE | ORAL | 2 refills | Status: AC

## 2021-01-15 ENCOUNTER — Other Ambulatory Visit: Payer: Self-pay | Admitting: Adult Health Nurse Practitioner

## 2021-01-15 DIAGNOSIS — Z1231 Encounter for screening mammogram for malignant neoplasm of breast: Secondary | ICD-10-CM

## 2021-02-17 ENCOUNTER — Ambulatory Visit
Admission: RE | Admit: 2021-02-17 | Discharge: 2021-02-17 | Disposition: A | Discharge status: Home / Self Care | Payer: Medicare PPO | Source: Ambulatory Visit | Attending: Adult Health Nurse Practitioner | Admitting: Adult Health Nurse Practitioner

## 2021-02-17 DIAGNOSIS — Z1231 Encounter for screening mammogram for malignant neoplasm of breast: Secondary | ICD-10-CM

## 2021-03-10 ENCOUNTER — Other Ambulatory Visit: Payer: Self-pay | Admitting: Medical

## 2022-01-27 ENCOUNTER — Other Ambulatory Visit: Payer: Self-pay | Admitting: Adult Health Nurse Practitioner

## 2022-01-27 DIAGNOSIS — Z1231 Encounter for screening mammogram for malignant neoplasm of breast: Secondary | ICD-10-CM

## 2022-03-18 ENCOUNTER — Ambulatory Visit
Admission: RE | Admit: 2022-03-18 | Discharge: 2022-03-18 | Disposition: A | Discharge status: Home / Self Care | Payer: Medicare PPO | Source: Ambulatory Visit | Attending: Adult Health Nurse Practitioner | Admitting: Adult Health Nurse Practitioner

## 2022-03-18 DIAGNOSIS — Z1231 Encounter for screening mammogram for malignant neoplasm of breast: Secondary | ICD-10-CM

## 2023-02-14 ENCOUNTER — Other Ambulatory Visit: Payer: Self-pay | Admitting: Adult Health Nurse Practitioner

## 2023-02-14 DIAGNOSIS — Z1231 Encounter for screening mammogram for malignant neoplasm of breast: Secondary | ICD-10-CM

## 2023-04-26 ENCOUNTER — Ambulatory Visit
Admission: RE | Admit: 2023-04-26 | Discharge: 2023-04-26 | Disposition: A | Discharge status: Home / Self Care | Source: Ambulatory Visit | Attending: Adult Health Nurse Practitioner | Admitting: Adult Health Nurse Practitioner

## 2023-04-26 DIAGNOSIS — Z1231 Encounter for screening mammogram for malignant neoplasm of breast: Secondary | ICD-10-CM
